# Patient Record
Sex: Male | Born: 1975 | Race: White | Hispanic: No | Marital: Married | Smoking: Never smoker
Health system: Southern US, Community
[De-identification: ages and names within clinical notes are randomized; demographics above are authoritative.]

## PROBLEM LIST (undated history)

## (undated) DIAGNOSIS — I10 Essential (primary) hypertension: Secondary | ICD-10-CM

## (undated) DIAGNOSIS — J45909 Unspecified asthma, uncomplicated: Secondary | ICD-10-CM

## (undated) HISTORY — PX: VASECTOMY: SHX75

## (undated) HISTORY — PX: SMALL INTESTINE SURGERY: SHX150

## (undated) HISTORY — DX: Unspecified asthma, uncomplicated: J45.909

---

## 2010-04-29 ENCOUNTER — Ambulatory Visit: Payer: Self-pay

## 2010-05-01 ENCOUNTER — Emergency Department: Payer: Self-pay | Admitting: Emergency Medicine

## 2013-05-14 ENCOUNTER — Emergency Department (HOSPITAL_BASED_OUTPATIENT_CLINIC_OR_DEPARTMENT_OTHER)
Admission: EM | Admit: 2013-05-14 | Discharge: 2013-05-14 | Disposition: A | Discharge status: Home / Self Care | Payer: BC Managed Care – PPO | Attending: Emergency Medicine | Admitting: Emergency Medicine

## 2013-05-14 ENCOUNTER — Encounter (HOSPITAL_BASED_OUTPATIENT_CLINIC_OR_DEPARTMENT_OTHER): Payer: Self-pay | Admitting: Emergency Medicine

## 2013-05-14 DIAGNOSIS — Y9229 Other specified public building as the place of occurrence of the external cause: Secondary | ICD-10-CM | POA: Insufficient documentation

## 2013-05-14 DIAGNOSIS — W1809XA Striking against other object with subsequent fall, initial encounter: Secondary | ICD-10-CM | POA: Insufficient documentation

## 2013-05-14 DIAGNOSIS — S0181XA Laceration without foreign body of other part of head, initial encounter: Secondary | ICD-10-CM

## 2013-05-14 DIAGNOSIS — Y9389 Activity, other specified: Secondary | ICD-10-CM | POA: Insufficient documentation

## 2013-05-14 DIAGNOSIS — F1010 Alcohol abuse, uncomplicated: Secondary | ICD-10-CM | POA: Insufficient documentation

## 2013-05-14 DIAGNOSIS — S0180XA Unspecified open wound of other part of head, initial encounter: Secondary | ICD-10-CM | POA: Insufficient documentation

## 2013-05-14 NOTE — Discharge Instructions (Signed)
Facial Laceration ° A facial laceration is a cut on the face. These injuries can be painful and cause bleeding. Lacerations usually heal quickly, but they need special care to reduce scarring. °DIAGNOSIS  °Your health care provider will take a medical history, ask for details about how the injury occurred, and examine the wound to determine how deep the cut is. °TREATMENT  °Some facial lacerations may not require closure. Others may not be able to be closed because of an increased risk of infection. The risk of infection and the chance for successful closure will depend on various factors, including the amount of time since the injury occurred. °The wound may be cleaned to help prevent infection. If closure is appropriate, pain medicines may be given if needed. Your health care provider will use stitches (sutures), wound glue (adhesive), or skin adhesive strips to repair the laceration. These tools bring the skin edges together to allow for faster healing and a better cosmetic outcome. If needed, you may also be given a tetanus shot. °HOME CARE INSTRUCTIONS °· Only take over-the-counter or prescription medicines as directed by your health care provider. °· Follow your health care provider's instructions for wound care. These instructions will vary depending on the technique used for closing the wound. °For Sutures: °· Keep the wound clean and dry.   °· If you were given a bandage (dressing), you should change it at least once a day. Also change the dressing if it becomes wet or dirty, or as directed by your health care provider.   °· Wash the wound with soap and water 2 times a day. Rinse the wound off with water to remove all soap. Pat the wound dry with a clean towel.   °· After cleaning, apply a thin layer of the antibiotic ointment recommended by your health care provider. This will help prevent infection and keep the dressing from sticking.   °· You may shower as usual after the first 24 hours. Do not soak the  wound in water until the sutures are removed.   °· Get your sutures removed as directed by your health care provider. With facial lacerations, sutures should usually be taken out after 4 5 days to avoid stitch marks.   °· Wait a few days after your sutures are removed before applying any makeup. °For Skin Adhesive Strips: °· Keep the wound clean and dry.   °· Do not get the skin adhesive strips wet. You may bathe carefully, using caution to keep the wound dry.   °· If the wound gets wet, pat it dry with a clean towel.   °· Skin adhesive strips will fall off on their own. You may trim the strips as the wound heals. Do not remove skin adhesive strips that are still stuck to the wound. They will fall off in time.   °For Wound Adhesive: °· You may briefly wet your wound in the shower or bath. Do not soak or scrub the wound. Do not swim. Avoid periods of heavy sweating until the skin adhesive has fallen off on its own. After showering or bathing, gently pat the wound dry with a clean towel.   °· Do not apply liquid medicine, cream medicine, ointment medicine, or makeup to your wound while the skin adhesive is in place. This may loosen the film before your wound is healed.   °· If a dressing is placed over the wound, be careful not to apply tape directly over the skin adhesive. This may cause the adhesive to be pulled off before the wound is healed.   °·   Avoid prolonged exposure to sunlight or tanning lamps while the skin adhesive is in place. °· The skin adhesive will usually remain in place for 5 10 days, then naturally fall off the skin. Do not pick at the adhesive film.   °After Healing: °Once the wound has healed, cover the wound with sunscreen during the day for 1 full year. This can help minimize scarring. Exposure to ultraviolet light in the first year will darken the scar. It can take 1 2 years for the scar to lose its redness and to heal completely.  °SEEK IMMEDIATE MEDICAL CARE IF: °· You have redness, pain, or  swelling around the wound.   °· You see a yellowish-white fluid (pus) coming from the wound.   °· You have chills or a fever.   °MAKE SURE YOU: °· Understand these instructions. °· Will watch your condition. °· Will get help right away if you are not doing well or get worse. °Document Released: 04/17/2004 Document Revised: 12/29/2012 Document Reviewed: 10/21/2012 °ExitCare® Patient Information ©2014 [employer]. ° °
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
## 2013-05-21 ENCOUNTER — Encounter (HOSPITAL_BASED_OUTPATIENT_CLINIC_OR_DEPARTMENT_OTHER): Payer: Self-pay | Admitting: Emergency Medicine

## 2013-05-21 ENCOUNTER — Emergency Department (HOSPITAL_BASED_OUTPATIENT_CLINIC_OR_DEPARTMENT_OTHER)
Admission: EM | Admit: 2013-05-21 | Discharge: 2013-05-21 | Disposition: A | Discharge status: Home / Self Care | Payer: BC Managed Care – PPO | Attending: Emergency Medicine | Admitting: Emergency Medicine

## 2013-05-21 DIAGNOSIS — Z4802 Encounter for removal of sutures: Secondary | ICD-10-CM

## 2013-05-21 NOTE — ED Notes (Signed)
Wound care complete with folded 2x2's over eyebrow repair site, then held in place with paper tape. I gave patient additional wound care supplies for home use.

## 2013-05-21 NOTE — ED Notes (Signed)
Pt here for suture removal, placed last friday

## 2013-05-21 NOTE — ED Provider Notes (Signed)
CSN: [id number]     Arrival date & time 05/21/13  0105 History   First MD Initiated Contact with Patient 05/21/13 0121     Chief Complaint  Patient presents with  . Suture / Staple Removal     (Consider location/radiation/quality/duration/timing/severity/associated sxs/prior Treatment) Patient is a 38 y.o. male presenting with suture removal.  Suture / Staple Removal This is a new problem. The current episode started more than 2 days ago. The problem occurs constantly. The problem has not changed since onset.Pertinent negatives include no chest pain, no abdominal pain, no headaches and no shortness of breath. Nothing aggravates the symptoms. Nothing relieves the symptoms. He has tried nothing for the symptoms. The treatment provided no relief.    History reviewed. No pertinent past medical history. Past Surgical History  Procedure Laterality Date  . Vasectomy     History reviewed. No pertinent family history. History  Substance Use Topics  . Smoking status: Never Smoker   . Smokeless tobacco: Never Used  . Alcohol Use: 0.6 oz/week    1 Shots of liquor per week     Comment: daily etoh use     Review of Systems  Respiratory: Negative for shortness of breath.   Cardiovascular: Negative for chest pain.  Gastrointestinal: Negative for abdominal pain.  Neurological: Negative for headaches.  All other systems reviewed and are negative.      Allergies  Review of patient's allergies indicates no known allergies.  Home Medications  No current outpatient prescriptions on file. BP 128/83  Temp(Src) 98 F (36.7 C) (Oral)  Resp 18  SpO2 98% Physical Exam  Constitutional: He is oriented to person, place, and time. He appears well-developed and well-nourished. No distress.  HENT:  Head: Normocephalic.  Eyes: EOM are normal.  Neck: Normal range of motion. Neck supple.  Musculoskeletal: Normal range of motion.  Neurological: He is alert and oriented to person, place, and time.   Skin: Skin is warm and dry.  Psychiatric: He has a normal mood and affect.    ED Course  Procedures (including critical care time) Labs Review Labs Reviewed - No data to display Imaging Review No results found.   EKG Interpretation None      MDM   Final diagnoses:  None    Suture over the right eye and under the right eye removed no warmth or drainage bandaged   Nathon Stefanski K Antwuan Eckley-Rasch, MD 05/21/13 0139

## 2013-05-21 NOTE — Discharge Instructions (Signed)
Staple Care and Removal Your caregiver has used staples today to repair your wound. Staples are used to help a wound heal faster by holding the edges of the wound together. The staples can be removed when the wound has healed well enough to stay together after the staples are removed. A dressing (wound covering), depending on the location of the wound, may have been applied. This may be changed once per day or as instructed. If the dressing sticks, it may be soaked off with soapy water or hydrogen peroxide. Only take over-the-counter or prescription medicines for pain, discomfort, or fever as directed by your caregiver.  If you did not receive a tetanus shot today because you did not recall when your last one was given, check with your caregiver when you have your staples removed to determine if one is needed. Return to your caregiver's office in 1 week or as suggested to have your staples removed. SEEK IMMEDIATE MEDICAL CARE IF:   You have redness, swelling, or increasing pain in the wound.  You have pus coming from the wound.  You have a fever.  You notice a bad smell coming from the wound or dressing.  Your wound edges break open after staples have been removed. Document Released: 12/03/2000 Document Revised: 06/02/2011 Document Reviewed: 12/18/2004 [facility] Patient Information 2014 [place], [place].  Suture Removal, Care After Refer to this sheet in the next few weeks. These instructions provide you with information on caring for yourself after your procedure. Your health care provider may also give you more specific instructions. Your treatment has been planned according to current medical practices, but problems sometimes occur. Call your health care provider if you have any problems or questions after your procedure. WHAT TO EXPECT AFTER THE PROCEDURE After your stitches (sutures) are removed, it is typical to have the following:  Some discomfort and swelling in the wound  area.  Slight redness in the area. HOME CARE INSTRUCTIONS   If you have skin adhesive strips over the wound area, do not take the strips off. They will fall off on their own in a few days. If the strips remain in place after 14 days, you may remove them.  Change any bandages (dressings) at least once a day or as directed by your health care provider. If the bandage sticks, soak it off with warm, soapy water.  Apply cream or ointment only as directed by your health care provider. If using cream or ointment, wash the area with soap and water 2 times a day to remove all the cream or ointment. Rinse off the soap and pat the area dry with a clean towel.  Keep the wound area dry and clean. If the bandage becomes wet or dirty, or if it develops a bad smell, change it as soon as possible.  Continue to protect the wound from injury.  Use sunscreen when out in the sun. New scars become sunburned easily. SEEK MEDICAL CARE IF:  You have increasing redness, swelling, or pain in the wound.  You see pus coming from the wound.  You have a fever.  You notice a bad smell coming from the wound or dressing.  Your wound breaks open (edges not staying together). Document Released: 12/03/2000 Document Revised: 12/29/2012 Document Reviewed: 10/20/2012 [facility] Patient Information 2014 [place], [place].  Scar Minimization You will have a scar anytime you have surgery and a cut is made in the skin or you have something removed from your skin (mole, skin cancer, cyst). Although scars  are unavoidable following surgery, there are ways to minimize their appearance. It is important to follow all the instructions you receive from your caregiver about wound care. How your wound heals will influence the appearance of your scar. If you do not follow the wound care instructions as directed, complications such as infection may occur. Wound instructions include keeping the wound clean, moist, and not letting the wound form  a scab. Some people form scars that are raised and lumpy (hypertrophic) or larger than the initial wound (keloidal). HOME CARE INSTRUCTIONS   Follow wound care instructions as directed.  Keep the wound clean by washing it with soap and water.  Keep the wound moist with provided antibiotic cream or petroleum jelly until completely healed. Moisten twice a day for about 2 weeks.  Get stitches (sutures) taken out at the scheduled time.  Avoid touching or manipulating your wound unless needed. Wash your hands thoroughly before and after touching your wound.  Follow all restrictions such as limits on exercise or work. This depends on where your scar is located.  Keep the scar protected from sunburn. Cover the scar with sunscreen/sunblock with SPF 30 or higher.  Gently massage the scar using a circular motion to help minimize the appearance of the scar. Do this only after the wound has closed and all the sutures have been removed.  For hypertrophic or keloidal scars, there are several ways to treat and minimize their appearance. Methods include compression therapy, intralesional corticosteroids, laser therapy, or surgery. These methods are performed by your caregiver. Remember that the scar may appear lighter or darker than your normal skin color. This difference in color should even out with time. SEEK MEDICAL CARE IF:   You have a fever.  You develop signs of infection such as pain, redness, pus, and warmth.  You have questions or concerns. Document Released: 08/28/2009 Document Revised: 06/02/2011 Document Reviewed: 08/28/2009 [facility] Patient Information 2014 [place], [place].

## 2013-05-21 NOTE — ED Notes (Signed)
MD at bedside, removing sutures.

## 2013-06-03 ENCOUNTER — Encounter (HOSPITAL_BASED_OUTPATIENT_CLINIC_OR_DEPARTMENT_OTHER): Payer: Self-pay | Admitting: Emergency Medicine

## 2013-06-03 ENCOUNTER — Emergency Department (HOSPITAL_BASED_OUTPATIENT_CLINIC_OR_DEPARTMENT_OTHER)
Admission: EM | Admit: 2013-06-03 | Discharge: 2013-06-03 | Disposition: A | Discharge status: Home / Self Care | Payer: BC Managed Care – PPO | Attending: Emergency Medicine | Admitting: Emergency Medicine

## 2013-06-03 DIAGNOSIS — IMO0002 Reserved for concepts with insufficient information to code with codable children: Secondary | ICD-10-CM | POA: Insufficient documentation

## 2013-06-03 DIAGNOSIS — Z8739 Personal history of other diseases of the musculoskeletal system and connective tissue: Secondary | ICD-10-CM | POA: Insufficient documentation

## 2013-06-03 DIAGNOSIS — M549 Dorsalgia, unspecified: Secondary | ICD-10-CM

## 2013-06-03 DIAGNOSIS — Y92410 Unspecified street and highway as the place of occurrence of the external cause: Secondary | ICD-10-CM | POA: Insufficient documentation

## 2013-06-03 DIAGNOSIS — Y9389 Activity, other specified: Secondary | ICD-10-CM | POA: Insufficient documentation

## 2013-06-03 LAB — URINALYSIS, ROUTINE W REFLEX MICROSCOPIC
BILIRUBIN URINE: NEGATIVE
GLUCOSE, UA: NEGATIVE mg/dL
HGB URINE DIPSTICK: NEGATIVE
Ketones, ur: >80 mg/dL — AB
Nitrite: NEGATIVE
PROTEIN: NEGATIVE mg/dL
Specific Gravity, Urine: 1.026 (ref 1.005–1.030)
Urobilinogen, UA: 1 mg/dL (ref 0.0–1.0)
pH: 7 (ref 5.0–8.0)

## 2013-06-03 LAB — URINE MICROSCOPIC-ADD ON

## 2013-06-03 MED ORDER — HYDROCODONE-ACETAMINOPHEN 5-325 MG PO TABS: 2.0000 | ORAL_TABLET | ORAL | Status: DC | PRN
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
## 2013-06-03 NOTE — ED Provider Notes (Signed)
Medical screening examination/treatment/procedure(s) were performed by non-physician practitioner and as supervising physician I was immediately available for consultation/collaboration.   EKG Interpretation None        Charles B. Bernette MayersSheldon, MD 06/03/13 [phone]
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
## 2013-06-08 ENCOUNTER — Ambulatory Visit (INDEPENDENT_AMBULATORY_CARE_PROVIDER_SITE_OTHER): Payer: BC Managed Care – PPO | Admitting: Family Medicine

## 2013-06-08 ENCOUNTER — Encounter: Payer: Self-pay | Admitting: Family Medicine

## 2013-06-08 VITALS — BP 142/86 | HR 75 | Ht 69.0 in | Wt 170.0 lb

## 2013-06-08 DIAGNOSIS — IMO0002 Reserved for concepts with insufficient information to code with codable children: Secondary | ICD-10-CM

## 2013-06-08 DIAGNOSIS — S3992XA Unspecified injury of lower back, initial encounter: Secondary | ICD-10-CM

## 2013-06-08 DIAGNOSIS — M5416 Radiculopathy, lumbar region: Secondary | ICD-10-CM

## 2013-06-08 MED ORDER — PREDNISONE (PAK) 10 MG PO TABS: ORAL_TABLET | ORAL | Status: DC

## 2013-06-08 MED ORDER — CYCLOBENZAPRINE HCL 10 MG PO TABS: 10.0000 mg | ORAL_TABLET | Freq: Three times a day (TID) | ORAL | Status: DC | PRN

## 2013-06-08 MED ORDER — HYDROCODONE-ACETAMINOPHEN 5-325 MG PO TABS: 1.0000 | ORAL_TABLET | Freq: Four times a day (QID) | ORAL | Status: DC | PRN

## 2013-06-08 NOTE — Patient Instructions (Signed)
You have lumbar radiculopathy (a pinched nerve in your low back from a disc herniation). A prednisone dose pack is the best option for immediate relief and may be prescribed. Day after finishing prednisone start aleve 2 tabs twice a day with food OR ibuprofen 600mg  three times a day with food for pain and inflammation. Norco as needed for severe pain (no driving on this medicine). Flexeril as needed for muscle spasms (no driving on this medicine if it makes you sleepy). Stay as active as possible. Call me in 1 week to let me know about your progress. If not improving, will consider further imaging (MRI). If improving would add physical therapy.

## 2013-06-13 ENCOUNTER — Encounter: Payer: Self-pay | Admitting: Family Medicine

## 2013-06-13 DIAGNOSIS — S3992XA Unspecified injury of lower back, initial encounter: Secondary | ICD-10-CM | POA: Insufficient documentation
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
## 2014-04-09 ENCOUNTER — Ambulatory Visit (INDEPENDENT_AMBULATORY_CARE_PROVIDER_SITE_OTHER): Payer: 59 | Admitting: Physician Assistant

## 2014-04-09 VITALS — BP 140/94 | HR 113 | Temp 98.6°F | Resp 18 | Ht 69.5 in | Wt 205.2 lb

## 2014-04-09 DIAGNOSIS — H938X3 Other specified disorders of ear, bilateral: Secondary | ICD-10-CM

## 2014-04-09 DIAGNOSIS — J029 Acute pharyngitis, unspecified: Secondary | ICD-10-CM

## 2014-04-09 DIAGNOSIS — R062 Wheezing: Secondary | ICD-10-CM

## 2014-04-09 DIAGNOSIS — R05 Cough: Secondary | ICD-10-CM

## 2014-04-09 DIAGNOSIS — J3489 Other specified disorders of nose and nasal sinuses: Secondary | ICD-10-CM

## 2014-04-09 DIAGNOSIS — R059 Cough, unspecified: Secondary | ICD-10-CM

## 2014-04-09 LAB — POCT RAPID STREP A (OFFICE): RAPID STREP A SCREEN: NEGATIVE

## 2014-04-09 MED ORDER — GUAIFENESIN ER 1200 MG PO TB12: 1.0000 | ORAL_TABLET | Freq: Two times a day (BID) | ORAL | Status: AC | PRN

## 2014-04-09 MED ORDER — LORATADINE 10 MG PO TABS: 10.0000 mg | ORAL_TABLET | Freq: Every day | ORAL | Status: AC

## 2014-04-09 MED ORDER — FLUTICASONE PROPIONATE 50 MCG/ACT NA SUSP: 2.0000 | Freq: Every day | NASAL | Status: AC

## 2014-04-09 MED ORDER — ALBUTEROL SULFATE HFA 108 (90 BASE) MCG/ACT IN AERS
2.0000 | INHALATION_SPRAY | RESPIRATORY_TRACT | Status: AC | PRN
Start: 2014-04-09 — End: ?

## 2014-04-09 MED ORDER — AMOXICILLIN-POT CLAVULANATE 875-125 MG PO TABS
1.0000 | ORAL_TABLET | Freq: Two times a day (BID) | ORAL | Status: DC
Start: 2014-04-09 — End: 2014-04-23

## 2014-04-09 NOTE — Progress Notes (Signed)
Subjective:    Patient ID: Stephen Donaldson, male    DOB: [birth year], 39 y.o.   MRN: [id number]  PCP: No PCP Per Patient  Chief Complaint  Patient presents with  . Sore Throat    x1 month (02/21/14); believes it is strep  . Generalized Body Aches    states his whole body hurts  . Ear Pain    both ears hurt  . Cough    not prod any phelgm  . Fever  . Chills  . Anorexia    has not ate in the last  24 hours   Prior to Admission medications   Medication Sig Start Date End Date Taking? Authorizing Provider  Zolpidem Tartrate (AMBIEN PO) Take by mouth.   Yes Historical Provider, MD   Medications, allergies, past medical history, surgical history, family history, social history and problem list reviewed and updated.  HPI  [age] yom with no significant pmh presents today with one month h/o sore throat, body aches, ear congestion, non prod cough.   Sx started approx 5-6 wks ago with gradual onset sore throat, nasal and head congestion, ears feeling full. These sx have persisted since that time. He did go to [place] for the holidays and the sx resolved during the time he was there. He has had intermittent mild non prod cough over past few wks, Feels that this is due to tickle in back of throat. Has had subjective fevers at home but has not checked temp. Has been taking goodys bc powder and   Has hx mild asthma as child but has not used inhalers in years.   He has never been diagnosed with allergies or had allergy sx before. He is from this area.   Denies abd pain, n/v, diarrhea, sob, cp.   Review of Systems No dysuria. No constipation.     Objective:   Physical Exam  Constitutional: He is oriented to person, place, and time. He appears well-developed and well-nourished.  Non-toxic appearance. He does not have a sickly appearance. He appears ill. No distress.  BP 140/94 mmHg  Pulse 113  Temp(Src) 98.6 F (37 C) (Oral)  Resp 18  Ht 5' 9.5" (1.765 m)  Wt 205 lb 3.2 oz (93.078 kg)   BMI 29.88 kg/m2  SpO2 98%   HENT:  Right Ear: Tympanic membrane is not erythematous. A middle ear effusion is present.  Left Ear: Tympanic membrane is not erythematous. A middle ear effusion is present.  Nose: Mucosal edema and rhinorrhea present. Right sinus exhibits no maxillary sinus tenderness and no frontal sinus tenderness. Left sinus exhibits maxillary sinus tenderness and frontal sinus tenderness.  Mouth/Throat: Uvula is midline, oropharynx is clear and moist and mucous membranes are normal. No oropharyngeal exudate, posterior oropharyngeal edema, posterior oropharyngeal erythema or tonsillar abscesses.  Congestion bilaterally. No bony landmarks bilaterally. No normal cone of light bilaterally. No erythema bilaterally.   Severe TTP left frontal sinus. Mod TTP maxillary sinus left.   Pt clearing his throat often while in clinic.   Cardiovascular: Normal rate, regular rhythm and normal heart sounds.  Exam reveals no gallop.   No murmur heard. Pulmonary/Chest: Effort normal. No accessory muscle usage. No tachypnea. No respiratory distress. He has no decreased breath sounds. He has wheezes in the left upper field. He has no rhonchi. He has no rales.  Mild end expiratory wheeze LUL.   Lymphadenopathy:       Head (right side): No submental, no submandibular and no tonsillar adenopathy present.  Head (left side): No submental, no submandibular and no tonsillar adenopathy present.    He has no cervical adenopathy.  Neurological: He is alert and oriented to person, place, and time.  Psychiatric: He has a normal mood and affect. His speech is normal and behavior is normal.      Assessment & Plan:   48 yom with no significant pmh presents today with one month h/o sore throat, body aches, ear congestion, non prod cough.   Sore throat - Plan: POCT rapid strep A, Culture, Group A Strep --strep swab neg, cx sent --tylenol/rest/fluids/lozenges  Congestion of both ears - Plan:  Guaifenesin (MUCINEX MAXIMUM STRENGTH) 1200 MG TB12, loratadine (CLARITIN) 10 MG tablet, fluticasone (FLONASE) 50 MCG/ACT nasal spray Cough Sinus pressure - Plan: amoxicillin-clavulanate (AUGMENTIN) 875-125 MG per tablet --sx most likely due to allergies/sllergic rhinitis with bilateral boggy nasal turbinates, clearing throat, ear congestion, cough due to post nasal drip --claritin, flonase daily --mucinex prn --due to ttp left sinuses and ongoing sx for several wks sent in augmentin 10 days as could have bacterial infx due to chronic congestion --rtc 4-5 days if not improving  Wheezing - Plan: albuterol (PROVENTIL HFA;VENTOLIN HFA) 108 (90 BASE) MCG/ACT inhaler --hx asthma as child --mild wheeze on exam --refilled albuterol    Donnajean Lopes, PA-C Physician Assistant-Certified [facility]  04/09/2014 11:49 AM

## 2014-04-09 NOTE — Patient Instructions (Signed)
Your strep swab was negative today. We sent a culture and I'll let you know if this comes back as positive.  I think your symptoms are most likely due to allergic rhinitis. Please take the claritin daily. Please use the flonase 2 sprays in each nostril once daily. Please take the mucinex twice daily. Drink plenty of water with this medication. Tylenol/iburprofen/rest/fluids for the sore throat.  As you've been congested for awhile and you have pain over your sinuses, you may have a sinus infection. Please take the augmentin twice daily for 10 days.  Use the albuterol inhaler if you feel like you're wheezing at all at home. Please return to clinic if you're not feeling better in 4-5 days.

## 2014-04-11 LAB — CULTURE, GROUP A STREP: Organism ID, Bacteria: NORMAL

## 2014-04-19 ENCOUNTER — Emergency Department
Admission: EM | Admit: 2014-04-19 | Discharge: 2014-04-19 | Discharge status: Absent without leave | Payer: 59 | Source: Home / Self Care

## 2014-04-23 ENCOUNTER — Ambulatory Visit (INDEPENDENT_AMBULATORY_CARE_PROVIDER_SITE_OTHER): Payer: 59 | Admitting: Physician Assistant

## 2014-04-23 VITALS — BP 136/78 | HR 109 | Temp 98.4°F | Resp 19 | Ht 69.0 in | Wt 203.8 lb

## 2014-04-23 DIAGNOSIS — Z5181 Encounter for therapeutic drug level monitoring: Secondary | ICD-10-CM

## 2014-04-23 DIAGNOSIS — G4700 Insomnia, unspecified: Secondary | ICD-10-CM

## 2014-04-23 DIAGNOSIS — J029 Acute pharyngitis, unspecified: Secondary | ICD-10-CM

## 2014-04-23 DIAGNOSIS — J302 Other seasonal allergic rhinitis: Secondary | ICD-10-CM

## 2014-04-23 LAB — CBC WITH DIFFERENTIAL/PLATELET
BASOS ABS: 0.1 10*3/uL (ref 0.0–0.1)
BASOS PCT: 1 % (ref 0–1)
EOS ABS: 0.6 10*3/uL (ref 0.0–0.7)
Eosinophils Relative: 7 % — ABNORMAL HIGH (ref 0–5)
HEMATOCRIT: 53.3 % — AB (ref 39.0–52.0)
HEMOGLOBIN: 18.4 g/dL — AB (ref 13.0–17.0)
LYMPHS ABS: 1.8 10*3/uL (ref 0.7–4.0)
Lymphocytes Relative: 20 % (ref 12–46)
MCH: 31.7 pg (ref 26.0–34.0)
MCHC: 34.5 g/dL (ref 30.0–36.0)
MCV: 91.7 fL (ref 78.0–100.0)
MONOS PCT: 7 % (ref 3–12)
MPV: 11.6 fL (ref 8.6–12.4)
Monocytes Absolute: 0.6 10*3/uL (ref 0.1–1.0)
NEUTROS ABS: 5.9 10*3/uL (ref 1.7–7.7)
NEUTROS PCT: 65 % (ref 43–77)
PLATELETS: 250 10*3/uL (ref 150–400)
RBC: 5.81 MIL/uL (ref 4.22–5.81)
RDW: 14.1 % (ref 11.5–15.5)
WBC: 9.1 10*3/uL (ref 4.0–10.5)

## 2014-04-23 MED ORDER — MAGIC MOUTHWASH W/LIDOCAINE: 5.0000 mL | ORAL | Status: AC | PRN

## 2014-04-23 MED ORDER — HYDROXYZINE HCL 25 MG PO TABS: 50.0000 mg | ORAL_TABLET | Freq: Once | ORAL | Status: AC

## 2014-04-23 NOTE — Progress Notes (Signed)
Subjective:    Patient ID: Windy Carina, male    DOB: November 02, 1975, 39 y.o.   MRN: [id number]  Chief Complaint  Patient presents with  . Follow-up    was here last Sunday, sore throat, worsening, has had for over a month   Prior to Admission medications   Medication Sig Start Date End Date Taking? Authorizing Provider  albuterol (PROVENTIL HFA;VENTOLIN HFA) 108 (90 BASE) MCG/ACT inhaler Inhale 2 puffs into the lungs every 4 (four) hours as needed for wheezing or shortness of breath (cough, shortness of breath or wheezing.). 04/09/14  Yes Sherrick Araki, PA  fluticasone (FLONASE) 50 MCG/ACT nasal spray Place 2 sprays into both nostrils daily. 04/09/14  Yes Olivene Cookston, PA  Guaifenesin (MUCINEX MAXIMUM STRENGTH) 1200 MG TB12 Take 1 tablet (1,200 mg total) by mouth every 12 (twelve) hours as needed. 04/09/14  Yes Kylee Nardozzi, PA  loratadine (CLARITIN) 10 MG tablet Take 1 tablet (10 mg total) by mouth daily. 04/09/14  Yes Kylena Mole, PA  Zolpidem Tartrate (AMBIEN PO) Take by mouth.   Yes Historical Provider, MD   Medications, allergies, past medical history, surgical history, family history, social history and problem list reviewed and updated.  HPI  78 yom returns to clinic for continued sore throat.   Pt initially seen by me 2 wks ago for sore throat. Strep swab neg, cx sent which grew normal upper resp flora. Was diagnosed with allergies and congestion as cause of sore throat. Started on flonase/claritin/mucinex. As congestion had been ongoing for 5 wks at that time and he was ttp over left sinuses was started on augmentin 10 day course for sinusitis.  Today he presents stating sore throat has not improved. He finished the augmentin course. He has not taken the claritin much feeling its worthless. He would like a different antihistamine. Not using the flonase daily. Does feel the head congestion is slightly improved. Has trouble sleeping at night as baseline with minimal relief with  ambien. His congestion and sore throat is keeping him up at night on top of usual insomnia.   Sore throat is worst in mornings. Gets better throughout day every day. Cepacol has helped at times. Denies drooling, odynophagia, dysphagia, stiff neck, muffled voice. Denies any sob or difficulty breathing. Able to eat, drink without trouble.   HR slightly elevated today. No fever. Pt states he has felt run down past few wks as well and that 1-2x/week he has had drenching night sweats. Pt has hx mono and thinks he may have again, denies any risk for hiv  Review of Systems No cp, fever, chills, ha, vision changes, n/v, abd pain, diarrhea.     Objective:   Physical Exam  Constitutional: He is oriented to person, place, and time. He appears well-developed and well-nourished.  Non-toxic appearance. He does not have a sickly appearance. He does not appear ill. No distress.  BP 136/78 mmHg  Pulse 109  Temp(Src) 98.4 F (36.9 C) (Oral)  Resp 19  Ht  (1.753 m)  Wt 203 lb 12.8 oz (92.443 kg)  BMI 30.08 kg/m2  SpO2 98%   HENT:  Right Ear: Tympanic membrane normal.  Left Ear: Tympanic membrane normal.  Nose: Mucosal edema and rhinorrhea present. Right sinus exhibits no maxillary sinus tenderness and no frontal sinus tenderness. Left sinus exhibits no maxillary sinus tenderness and no frontal sinus tenderness.  Mouth/Throat: Uvula is midline, oropharynx is clear and moist and mucous membranes are normal. No dental abscesses or uvula  swelling. No oropharyngeal exudate, posterior oropharyngeal edema, posterior oropharyngeal erythema or tonsillar abscesses.  Neck: No tracheal deviation present. No Brudzinski's sign noted.  Cardiovascular: Normal rate, regular rhythm and normal heart sounds.   Pulmonary/Chest: Effort normal and breath sounds normal. No stridor. He has no decreased breath sounds. He has no wheezes. He has no rhonchi. He has no rales.  Abdominal: Soft. Normal appearance and bowel sounds  are normal. There is no splenomegaly. There is no tenderness.  Lymphadenopathy:       Head (right side): Submandibular adenopathy present. No submental and no tonsillar adenopathy present.       Head (left side): Submandibular adenopathy present. No submental and no tonsillar adenopathy present.       Right cervical: No posterior cervical adenopathy present.      Left cervical: No posterior cervical adenopathy present.  Neurological: He is alert and oriented to person, place, and time. No cranial nerve deficit.  Psychiatric: He has a normal mood and affect. His speech is normal.      Assessment & Plan:   [age] yom returns to clinic with continued sore throat.   Sore throat - Plan: Epstein-Barr virus VCA antibody panel, CBC with Differential/Platelet, Alum & Mag Hydroxide-Simeth (MAGIC MOUTHWASH W/LIDOCAINE) SOLN --benign exam today and no fever today or at home, culture normal from 2 wks ago, very low suspicion for fusobacterium, retropharyngeal/peritonsillar abscess, only 1/4 centor criteria for strep and neg strep swab 2 wks ago so doubt strep, also no relief with augmentin --as is worst in am and he is still congested, believe still due to congestion and mouth breathing at night --flonase/hydroxyzine for allergies which may help with insomnia as well, humidifier in room, tylenol/cepacol --has hx gerd --> start zantac bid --dukes magic mouthwash with lido as pt has singing event in 6 days, this may provide temp relief but will not cure --mono test with fatigue, night sweats, sore throat --declines hiv testing --ent referral if no relief  Seasonal allergies - Plan: hydrOXYzine (ATARAX/VISTARIL) 25 MG tablet  Insomnia - Plan: hydrOXYzine (ATARAX/VISTARIL) 25 MG tablet  Encounter for therapeutic drug monitoring - Plan: Basic metabolic panel --bmp due to starting hydroxyzine  Donnajean Lopesodd M. Maebell Lyvers, PA-C Physician Assistant-Certified [facility]  04/24/2014 11:57 AM

## 2014-04-23 NOTE — Patient Instructions (Signed)
We drew labs today to check for mono and I'll be in contact with you with those results. For allergies, please use the flonase daily. Please switch the claritin to zyrtec.  For reflux, please take zantac twice daily. Limiting alcohol will likely help with this as well.  For the pain, continue to take the tylenol as needed, use cepacol as needed. A humidifier in your room at night will help. Please use [employer]'s Magic Mouthwash every 2 hours as needed for the pain.  If none of these measures work, please let me know. I'll refer you to see an ENT doctor.

## 2014-04-24 LAB — EPSTEIN-BARR VIRUS VCA ANTIBODY PANEL
EBV EA IgG: 18.7 U/mL — ABNORMAL HIGH (ref <9.0)
EBV NA IGG: 77.3 U/mL — AB (ref <18.0)
EBV VCA IGG: 166 U/mL — AB (ref <18.0)

## 2014-04-24 LAB — BASIC METABOLIC PANEL
BUN: 8 mg/dL (ref 6–23)
CO2: 26 mEq/L (ref 19–32)
Calcium: 9.6 mg/dL (ref 8.4–10.5)
Chloride: 102 mEq/L (ref 96–112)
Creat: 1.14 mg/dL (ref 0.50–1.35)
Glucose, Bld: 78 mg/dL (ref 70–99)
POTASSIUM: 4.6 meq/L (ref 3.5–5.3)
SODIUM: 138 meq/L (ref 135–145)

## 2014-04-25 ENCOUNTER — Telehealth: Payer: Self-pay | Admitting: Radiology

## 2014-04-25 ENCOUNTER — Telehealth: Payer: Self-pay | Admitting: Physician Assistant

## 2014-04-25 NOTE — Telephone Encounter (Signed)
Informed pt mono test was negative for current infx. He states his sore throat is better with tx we discussed yesterday. He also mentions he has been taking a workout supplement for bodybuilding that has se of hr elevation. Notable as HR has been elevated last 2 visits. Informed pt eosinophils elevated on cbc, further making allergic rhinitis likely.

## 2014-04-25 NOTE — Telephone Encounter (Signed)
Pt would like to know results of mono tests. Advised we would have you review first and contact him back with the results.

## 2014-06-22 ENCOUNTER — Encounter (HOSPITAL_BASED_OUTPATIENT_CLINIC_OR_DEPARTMENT_OTHER): Payer: Self-pay

## 2014-06-22 ENCOUNTER — Emergency Department (HOSPITAL_BASED_OUTPATIENT_CLINIC_OR_DEPARTMENT_OTHER)
Admission: EM | Admit: 2014-06-22 | Discharge: 2014-06-22 | Disposition: A | Discharge status: Home / Self Care | Payer: 59 | Attending: Emergency Medicine | Admitting: Emergency Medicine

## 2014-06-22 ENCOUNTER — Emergency Department (HOSPITAL_BASED_OUTPATIENT_CLINIC_OR_DEPARTMENT_OTHER): Payer: 59

## 2014-06-22 DIAGNOSIS — J45909 Unspecified asthma, uncomplicated: Secondary | ICD-10-CM | POA: Insufficient documentation

## 2014-06-22 DIAGNOSIS — R0789 Other chest pain: Secondary | ICD-10-CM | POA: Insufficient documentation

## 2014-06-22 DIAGNOSIS — Z7951 Long term (current) use of inhaled steroids: Secondary | ICD-10-CM | POA: Diagnosis not present

## 2014-06-22 DIAGNOSIS — Z79899 Other long term (current) drug therapy: Secondary | ICD-10-CM | POA: Insufficient documentation

## 2014-06-22 DIAGNOSIS — R002 Palpitations: Secondary | ICD-10-CM | POA: Diagnosis present

## 2014-06-22 LAB — CBC WITH DIFFERENTIAL/PLATELET
Basophils Absolute: 0 10*3/uL (ref 0.0–0.1)
Basophils Relative: 0 % (ref 0–1)
EOS ABS: 0.5 10*3/uL (ref 0.0–0.7)
EOS PCT: 6 % — AB (ref 0–5)
HCT: 48.2 % (ref 39.0–52.0)
Hemoglobin: 16.7 g/dL (ref 13.0–17.0)
LYMPHS PCT: 14 % (ref 12–46)
Lymphs Abs: 1.1 10*3/uL (ref 0.7–4.0)
MCH: 31.9 pg (ref 26.0–34.0)
MCHC: 34.6 g/dL (ref 30.0–36.0)
MCV: 92.2 fL (ref 78.0–100.0)
MONO ABS: 0.8 10*3/uL (ref 0.1–1.0)
Monocytes Relative: 9 % (ref 3–12)
Neutro Abs: 5.8 10*3/uL (ref 1.7–7.7)
Neutrophils Relative %: 71 % (ref 43–77)
Platelets: 192 10*3/uL (ref 150–400)
RBC: 5.23 MIL/uL (ref 4.22–5.81)
RDW: 13.3 % (ref 11.5–15.5)
WBC: 8.2 10*3/uL (ref 4.0–10.5)

## 2014-06-22 LAB — BASIC METABOLIC PANEL
Anion gap: 8 (ref 5–15)
BUN: 10 mg/dL (ref 6–23)
CO2: 26 mmol/L (ref 19–32)
Calcium: 9.4 mg/dL (ref 8.4–10.5)
Chloride: 104 mmol/L (ref 96–112)
Creatinine, Ser: 1.05 mg/dL (ref 0.50–1.35)
GFR calc Af Amer: >90 mL/min (ref >90)
GFR, EST NON AFRICAN AMERICAN: 87 mL/min — AB (ref >90)
Glucose, Bld: 98 mg/dL (ref 70–99)
POTASSIUM: 3.5 mmol/L (ref 3.5–5.1)
Sodium: 138 mmol/L (ref 135–145)

## 2014-06-22 LAB — TROPONIN I

## 2014-06-22 MED ORDER — LORAZEPAM 1 MG PO TABS
1.0000 mg | ORAL_TABLET | Freq: Once | ORAL | Status: AC
  Administered 2014-06-22: 1 mg via ORAL
  Filled 2014-06-22: qty 1

## 2014-06-22 MED ORDER — IBUPROFEN 400 MG PO TABS
400.0000 mg | ORAL_TABLET | Freq: Four times a day (QID) | ORAL | Status: AC | PRN
Start: 2014-06-22 — End: ?

## 2014-06-22 NOTE — ED Notes (Signed)
Pt reports palpitations today. Reports feeling "jittery" and "like I'm having a panic attack. Reports some nausea.

## 2014-06-22 NOTE — ED Provider Notes (Signed)
CSN: [id number]     Arrival date & time 06/22/14  1821 History   First MD Initiated Contact with Patient 06/22/14 1833     Chief Complaint  Patient presents with  . Palpitations     (Consider location/radiation/quality/duration/timing/severity/associated sxs/prior Treatment) HPI Stephen Donaldson is a 39 y.o. male withno Past medical history comes in for evaluation of chest discomfort and jitteriness. Patient states earlier today at approximately 1 PM he was sitting at his desk when he began to have sharp shooting pains in his lower left chest that were fleeting in nature and rated as a 3/10. He reports associated nausea without vomiting or diaphoresis. The pain does not radiate He denies any shortness of breath, headache, unilateral numbness or weakness. Patient does report that he works out frequently and has never experienced this discomfort following vigorous exercise. Patient reports one month ago he was doing bench press and injured the top of his left wrist and would like to have it x-rayed today. Denies any decrease in range of motion, skin color change or sensation. Patient does have a primary care doctor but has not discussed his symptoms with him. No other aggravating or modifying factors. He is concerned that his chest discomfort could be related to anxiety as he reports he is always anxious. Reports a history of panic attack 20 years ago.  Past Medical History  Diagnosis Date  . Asthma    Past Surgical History  Procedure Laterality Date  . Vasectomy    . Small intestine surgery     Family History  Problem Relation Age of Onset  . Diabetes Mother    History  Substance Use Topics  . Smoking status: Never Smoker   . Smokeless tobacco: Never Used  . Alcohol Use: 0.6 oz/week    1 Shots of liquor per week     Comment: daily etoh use     Review of Systems A 10 point review of systems was completed and was negative except for pertinent positives and negatives as mentioned in  the history of present illness     Allergies  Review of patient's allergies indicates no known allergies.  Home Medications   Prior to Admission medications   Medication Sig Start Date End Date Taking? Authorizing Provider  albuterol (PROVENTIL HFA;VENTOLIN HFA) 108 (90 BASE) MCG/ACT inhaler Inhale 2 puffs into the lungs every 4 (four) hours as needed for wheezing or shortness of breath (cough, shortness of breath or wheezing.). 04/09/14   Donnajean Lopesodd M McVeigh, PA  Alum & Mag Hydroxide-Simeth (MAGIC MOUTHWASH W/LIDOCAINE) SOLN Take 5 mLs by mouth every 2 (two) hours as needed for mouth pain. 50/50 ratio with lidocaine. 04/23/14   Huey Bienenstockodd M McVeigh, PA  fluticasone (FLONASE) 50 MCG/ACT nasal spray Place 2 sprays into both nostrils daily. 04/09/14   Huey Bienenstockodd M McVeigh, PA  Guaifenesin [facility](MUCINEX MAXIMUM STRENGTH) 1200 MG TB12 Take 1 tablet (1,200 mg total) by mouth every 12 (twelve) hours as needed. 04/09/14   Huey Bienenstockodd M McVeigh, PA  hydrOXYzine (ATARAX/VISTARIL) 25 MG tablet Take 2 tablets (50 mg total) by mouth once. Please take once 30-60 minutes before sleep. 04/23/14   Huey Bienenstockodd M McVeigh, PA  ibuprofen (ADVIL,MOTRIN) 400 MG tablet Take 1 tablet (400 mg total) by mouth every 6 (six) hours as needed. 06/22/14   Joycie PeekBenjamin Achilles Neville, PA-C  loratadine (CLARITIN) 10 MG tablet Take 1 tablet (10 mg total) by mouth daily. 04/09/14   Huey Bienenstockodd M McVeigh, PA  Zolpidem Tartrate (AMBIEN PO) Take by mouth.  Historical Provider, MD   BP 142/82 mmHg  Pulse 92  Temp(Src) 99 F (37.2 C) (Oral)  Resp 22  Ht  (1.778 m)  Wt 190 lb (86.183 kg)  BMI 27.26 kg/m2  SpO2 98% Physical Exam  Constitutional: He is oriented to person, place, and time. He appears well-developed and well-nourished.  HENT:  Head: Normocephalic and atraumatic.  Mouth/Throat: Oropharynx is clear and moist.  Eyes: Conjunctivae are normal. Pupils are equal, round, and reactive to light. Right eye exhibits no discharge. Left eye exhibits no discharge. No scleral  icterus.  Neck: Neck supple.  Cardiovascular: Normal rate, regular rhythm and normal heart sounds.   Pulmonary/Chest: Effort normal and breath sounds normal. No respiratory distress. He has no wheezes. He has no rales.  Abdominal: Soft. There is no tenderness.  Musculoskeletal: He exhibits no tenderness.  Neurological: He is alert and oriented to person, place, and time.  Cranial Nerves II-XII grossly intact. Motor and sensation 5/5 in all 4 extremities. Completes finger to nose and heel to shin coordination movements without difficulty. Extraocular movements are intact without nystagmus. No pronator drift  Skin: Skin is warm and dry. No rash noted.  Psychiatric: He has a normal mood and affect.  Nursing note and vitals reviewed.   ED Course  Procedures (including critical care time) Labs Review Labs Reviewed  CBC WITH DIFFERENTIAL/PLATELET - Abnormal; Notable for the following:    Eosinophils Relative 6 (*)    All other components within normal limits  BASIC METABOLIC PANEL - Abnormal; Notable for the following:    GFR calc non Af Amer 87 (*)    All other components within normal limits  TROPONIN I    Imaging Review Dg Chest 2 View  06/22/2014   CLINICAL DATA:  Chest pain, nausea, palpitations.  EXAM: CHEST  2 VIEW  COMPARISON:  None.  FINDINGS: Minimal hyperinflation and bronchial thickening. The cardiomediastinal contours are normal. Pulmonary vasculature is normal. No consolidation, pleural effusion, or pneumothorax. No acute osseous abnormalities are seen.  IMPRESSION: Minimal bronchial thickening and hyperinflation, may be chronic or related to acute bronchitis.   Electronically Signed   By: Rubye Oaks M.D.   On: 06/22/2014 19:02   Dg Wrist Complete Left  06/22/2014   CLINICAL DATA:  Posterior LEFT wrist pain for 1 month. Possible lifting injury at the gym. Initial encounter.  EXAM: LEFT WRIST - COMPLETE 3+ VIEW  COMPARISON:  None.  FINDINGS: No fracture. Carpal spacing  normal. Anatomic alignment. Mild soft tissue swelling is present over the dorsum of the wrist on the lateral view.  IMPRESSION: No osseous injury.   Electronically Signed   By: Andreas Newport M.D.   On: 06/22/2014 19:07     EKG Interpretation   Date/Time:  Thursday June 22 2014 18:27:46 EDT Ventricular Rate:  96 PR Interval:  134 QRS Duration: 86 QT Interval:  312 QTC Calculation: 394 R Axis:   74 Text Interpretation:  Normal sinus rhythm with sinus arrhythmia Biatrial  enlargement Septal infarct , age undetermined Abnormal ECG Confirmed by  DELOS  MD, DOUGLAS ([id number]) on 06/22/2014 6:32:59 PM     Meds given in ED:  Medications  LORazepam (ATIVAN) tablet 1 mg (1 mg Oral Given 06/22/14 1948)    Discharge Medication List as of 06/22/2014  8:11 PM    START taking these medications   Details  ibuprofen (ADVIL,MOTRIN) 400 MG tablet Take 1 tablet (400 mg total) by mouth every 6 (six) hours as needed., Starting  06/22/2014, Until Discontinued, Print       Filed Vitals:   06/22/14 1825 06/22/14 1832 06/22/14 2000  BP: 168/86 153/85 142/82  Pulse: 100 96 92  Temp: 99 F (37.2 C)    TempSrc: Oral    Resp: 22    Height:  (1.778 m)    Weight: 190 lb (86.183 kg)    SpO2: 98% 100% 98%    MDM  Vitals stable - WNL -afebrile Pt resting comfortably in ED. Discomfort resolves in ED with PO Ativan. PE--normal long and cardiac exam. Normal neuro exam. Grossly benign physical exam Labwork: Noncontributory, troponin negative, EKG not concerning. Imaging: Chest x-ray shows potential bronchitis, but otherwise no evidence of acute cardiopulmonary pathology. Patient denies any cough or respiratory complaint.  DDX--patient with fleeting, intermittent sharp chest discomfort over the past month located over left chest that is not exacerbated with vigorous activity. Patient with a heart score of 1 due to family history. PERC negative. No evidence of pneumothorax, esophageal rupture or other  mediastinitis. Patient symptoms likely secondary to anxiety. Discussed follow-up with primary care for further evaluation and management of symptoms. Low concern for other acute or emergent pathology at this  time. I discussed all relevant lab findings and imaging results with pt and they verbalized understanding. Discussed f/u with PCP within 48 hrs and return precautions, pt very amenable to plan.  Final diagnoses:  Chest discomfort        Joycie Peek, PA-C 06/23/14 1324  Geoffery Lyons, MD 06/24/14 2306

## 2014-06-22 NOTE — Discharge Instructions (Signed)
Chest Pain (Nonspecific) °It is often hard to give a specific diagnosis for the cause of chest pain. There is always a chance that your pain could be related to something serious, such as a heart attack or a blood clot in the lungs. You need to follow up with your health care provider for further evaluation. °CAUSES  °· Heartburn. °· Pneumonia or bronchitis. °· Anxiety or stress. °· Inflammation around your heart (pericarditis) or lung (pleuritis or pleurisy). °· A blood clot in the lung. °· A collapsed lung (pneumothorax). It can develop suddenly on its own (spontaneous pneumothorax) or from trauma to the chest. °· Shingles infection (herpes zoster virus). °The chest wall is composed of bones, muscles, and cartilage. Any of these can be the source of the pain. °· The bones can be bruised by injury. °· The muscles or cartilage can be strained by coughing or overwork. °· The cartilage can be affected by inflammation and become sore (costochondritis). °DIAGNOSIS  °Lab tests or other studies may be needed to find the cause of your pain. Your health care provider may have you take a test called an ambulatory electrocardiogram (ECG). An ECG records your heartbeat patterns over a 24-hour period. You may also have other tests, such as: °· Transthoracic echocardiogram (TTE). During echocardiography, sound waves are used to evaluate how blood flows through your heart. °· Transesophageal echocardiogram (TEE). °· Cardiac monitoring. This allows your health care provider to monitor your heart rate and rhythm in real time. °· Holter monitor. This is a portable device that records your heartbeat and can help diagnose heart arrhythmias. It allows your health care provider to track your heart activity for several days, if needed. °· Stress tests by exercise or by giving medicine that makes the heart beat faster. °TREATMENT  °· Treatment depends on what may be causing your chest pain. Treatment may include: °¨ Acid blockers for  heartburn. °¨ Anti-inflammatory medicine. °¨ Pain medicine for inflammatory conditions. °¨ Antibiotics if an infection is present. °· You may be advised to change lifestyle habits. This includes stopping smoking and avoiding alcohol, caffeine, and chocolate. °· You may be advised to keep your head raised (elevated) when sleeping. This reduces the chance of acid going backward from your stomach into your esophagus. °Most of the time, nonspecific chest pain will improve within 2-3 days with rest and mild pain medicine.  °HOME CARE INSTRUCTIONS  °· If antibiotics were prescribed, take them as directed. Finish them even if you start to feel better. °· For the next few days, avoid physical activities that bring on chest pain. Continue physical activities as directed. °· Do not use any tobacco products, including cigarettes, chewing tobacco, or electronic cigarettes. °· Avoid drinking alcohol. °· Only take medicine as directed by your health care provider. °· Follow your health care provider's suggestions for further testing if your chest pain does not go away. °· Keep any follow-up appointments you made. If you do not go to an appointment, you could develop lasting (chronic) problems with pain. If there is any problem keeping an appointment, call to reschedule. °SEEK MEDICAL CARE IF:  °· Your chest pain does not go away, even after treatment. °· You have a rash with blisters on your chest. °· You have a fever. °SEEK IMMEDIATE MEDICAL CARE IF:  °· You have increased chest pain or pain that spreads to your arm, neck, jaw, back, or abdomen. °· You have shortness of breath. °· You have an increasing cough, or you cough   up blood.  You have severe back or abdominal pain.  You feel nauseous or vomit.  You have severe weakness.  You faint.  You have chills. This is an emergency. Do not wait to see if the pain will go away. Get medical help at once. Call your local emergency services (911 in [place] Do not drive  yourself to the hospital. MAKE SURE YOU:   Understand these instructions.  Will watch your condition.  Will get help right away if you are not doing well or get worse. Document Released: 12/18/2004 Document Revised: 03/15/2013 Document Reviewed: 10/14/2007 [facility] Patient Information 2015 [employer] [place]. This information is not intended to replace advice given to you by your health care provider. Make sure you discuss any questions you have with your health care provider.   Your evaluation the ED today did not show any emergent causes for your chest discomfort. Your x-rays were negative. It is important for you to follow-up with her primary care for further evaluation and management of your symptoms. Return to ED for new or worsening symptoms. Please take your anti-inflammatories as needed for your wrist discomfort.

## 2016-09-17 ENCOUNTER — Ambulatory Visit
Admission: RE | Admit: 2016-09-17 | Discharge: 2016-09-17 | Disposition: A | Discharge status: Home / Self Care | Payer: 59 | Source: Ambulatory Visit | Attending: Nurse Practitioner | Admitting: Nurse Practitioner

## 2016-09-17 ENCOUNTER — Other Ambulatory Visit: Payer: Self-pay | Admitting: Nurse Practitioner

## 2016-09-17 DIAGNOSIS — R229 Localized swelling, mass and lump, unspecified: Secondary | ICD-10-CM

## 2016-10-15 ENCOUNTER — Other Ambulatory Visit: Payer: Self-pay | Admitting: General Surgery

## 2016-10-15 DIAGNOSIS — R222 Localized swelling, mass and lump, trunk: Secondary | ICD-10-CM

## 2016-11-05 ENCOUNTER — Inpatient Hospital Stay
Admission: RE | Admit: 2016-11-05 | Discharge: 2016-11-05 | Disposition: A | Discharge status: Home / Self Care | Payer: 59 | Source: Ambulatory Visit | Attending: General Surgery | Admitting: General Surgery

## 2017-04-09 ENCOUNTER — Ambulatory Visit: Payer: 59 | Attending: Nurse Practitioner | Admitting: Physical Therapy

## 2017-04-09 ENCOUNTER — Encounter: Payer: Self-pay | Admitting: Physical Therapy

## 2017-04-09 ENCOUNTER — Other Ambulatory Visit: Payer: Self-pay

## 2017-04-09 DIAGNOSIS — M542 Cervicalgia: Secondary | ICD-10-CM | POA: Insufficient documentation

## 2017-04-09 DIAGNOSIS — R252 Cramp and spasm: Secondary | ICD-10-CM | POA: Insufficient documentation

## 2017-04-09 DIAGNOSIS — M5412 Radiculopathy, cervical region: Secondary | ICD-10-CM | POA: Diagnosis present

## 2017-04-09 NOTE — Therapy (Signed)
[facility] [address] [place], [place], [address] Phone: [phone]   Fax:  [phone]  Physical Therapy Evaluation  Patient Details  Name: Stephen Donaldson MRN: [id number] Date of Birth: 1975-04-13 Referring Provider: Babette Relic Body   Encounter Date: 04/09/2017  PT End of Session - 04/09/17 1525    Visit Number  1    Date for PT Re-Evaluation  06/07/17    PT Start Time  1445    PT Stop Time  1543    PT Time Calculation (min)  58 min    Activity Tolerance  Patient tolerated treatment well    Behavior During Therapy  [facility] for tasks assessed/performed       Past Medical History:  Diagnosis Date  . Asthma     Past Surgical History:  Procedure Laterality Date  . SMALL INTESTINE SURGERY    . VASECTOMY      There were no vitals filed for this visit.   Subjective Assessment - 04/09/17 1450    Subjective  Pateint reports that about 3 weeks ago he was at the gym and started having neck and shoulder pain.  He is unsure of a cause, but reports that he does lift heavy weight at the gym.  X-ray was positive for spondylosis at a few levels but was rated as mild    Patient Stated Goals  return to the gym    Currently in Pain?  Yes    Pain Score  4     Pain Location  Neck left shoulder    Pain Orientation  Left    Pain Descriptors / Indicators  Aching;Shooting    Pain Type  Acute pain    Pain Radiating Towards  c/o N/T in the left shoulder to the hand, reports neck pain is constant but the shoulder comes and goes    Pain Onset  1 to 4 weeks ago    Pain Frequency  Constant    Aggravating Factors   Motions of head, reaching up to 9/10, sometimes at end of the day    Pain Relieving Factors  reports that nothing really has helped at best pain  3/10    Effect of Pain on Daily Activities  can't work out and pain at the end of the day         [facility] PT Assessment - 04/09/17 0001      Assessment   Medical Diagnosis   cervicalgia and left shoulder pain    Referring Provider  Tammy Body    Onset Date/Surgical Date  03/19/17    Hand Dominance  Right    Prior Therapy  no      Precautions   Precautions  None      Balance Screen   Has the patient fallen in the past 6 months  No    Has the patient had a decrease in activity level because of a fear of falling?   No    Is the patient reluctant to leave their home because of a fear of falling?   No      Prior Function   Level of Independence  Independent    Vocation  Full time employment    Vocation Requirements  desk job    Leisure  works out 3-4x/week      [employer] Comments  fwd head, severe IR of the shoulders      ROM / Strength   AROM /  PROM / Strength  AROM;Strength      AROM   Overall AROM Comments  cervical ROM flexion WNL's, extension was decreased 50% with pain in the neck, rotation was decreased 25% with pain rotating to the left, side bending WNL''s with pain to the left      Strength   Overall Strength Comments  WNL's, some pain in the left shoulder with ER      Flexibility   Soft Tissue Assessment /Muscle Length  -- very tight IR of the shoulders, tight neural tension      Palpation   Palpation comment  pain in the left arm with palpation on the left cervical area, has spasms in the cervical area      Special Tests   Other special tests  all shoulder tests were negative             Objective measurements completed on examination: See above findings.      [facility] Adult PT Treatment/Exercise - 04/09/17 0001      Modalities   Modalities  Electrical Stimulation;Traction      Programme researcher, broadcasting/film/video Location  C/T area    Statistician Action  IFC    Electrical Stimulation Parameters  supine    Electrical Stimulation Goals  Pain      Traction   Type of Traction  Cervical    Min (lbs)  18    Hold Time  static    Time  pa             PT Education -  04/09/17 1525    Education provided  Yes    Education Details  went over posture, went over gym exercises and advised to stop doing exercises behind the neck    Person(s) Educated  Patient    Methods  Explanation    Comprehension  Verbalized understanding          PT Long Term Goals - 04/09/17 1530      PT LONG TERM GOAL #1   Title  understand proper posture and body mechanics    Time  8    Period  Weeks    Status  New      PT LONG TERM GOAL #2   Title  understand the gym exercises that may cause increased problems for shoulder and neck    Time  8    Period  Weeks    Status  New      PT LONG TERM GOAL #3   Title  decrease pain 50%    Time  8    Period  Weeks    Status  New      PT LONG TERM GOAL #4   Title  decrease shoulder symptoms 50%    Time  8    Period  Weeks    Status  New      PT LONG TERM GOAL #5   Title  increase cervical ROM to WNL's    Time  8    Period  Weeks    Status  New             Plan - 04/09/17 1526    Clinical Impression Statement  Patient reports 3 weeks ago having pain in the neck and left shoulder, unsure of a specific cause.  He worksout 3-4days a week he does lift heavy weight.  He has constant neck pain and at times has left shoulder pain and then has N/T in  the left arm to the hand.  He has no thoracic curve, he has very rounded shoulders with the arms held in slight flexion and a lot of IR. Cervical ROM was limited for extension and left rotation.  Shoulder ROM was WFL's, had some pain with MMT for ER.       Clinical Presentation  Stable    Clinical Decision Making  Low    Rehab Potential  Good    PT Frequency  2x / week    PT Duration  8 weeks    PT Treatment/Interventions  ADLs/Self Care Home Management;Cryotherapy;Electrical Stimulation;Traction;Ultrasound;Neuromuscular re-education;Therapeutic exercise;Therapeutic activities;Patient/family education;Manual techniques    PT Next Visit Plan  start the upper back strengthening     Consulted and Agree with Plan of Care  Patient       Patient will benefit from skilled therapeutic intervention in order to improve the following deficits and impairments:  Decreased range of motion, Increased muscle spasms, Pain, Impaired flexibility, Postural dysfunction  Visit Diagnosis: Cervicalgia - Plan: PT plan of care cert/re-cert  Radiculopathy, cervical region - Plan: PT plan of care cert/re-cert  Cramp and spasm - Plan: PT plan of care cert/re-cert     Problem List Patient Active Problem List   Diagnosis Date Noted  . Seasonal allergies 04/23/2014  . Lower back injury 06/13/2013    Jearld LeschALBRIGHT,Evora Schechter W., PT 04/09/2017, 4:07 PM  [facility] [address] [place], [place], [address] Phone: [phone]   Fax:  [phone]  Name: Stephen Donaldson MRN: [id number] Date of Birth: 06-Sep-1975

## 2017-04-15 ENCOUNTER — Ambulatory Visit: Payer: 59 | Admitting: Physical Therapy

## 2017-04-15 ENCOUNTER — Encounter: Payer: Self-pay | Admitting: Physical Therapy

## 2017-04-15 DIAGNOSIS — M5412 Radiculopathy, cervical region: Secondary | ICD-10-CM

## 2017-04-15 DIAGNOSIS — M542 Cervicalgia: Secondary | ICD-10-CM

## 2017-04-15 DIAGNOSIS — R252 Cramp and spasm: Secondary | ICD-10-CM

## 2017-04-15 NOTE — Therapy (Signed)
[facility] [address] [place], [place], [address] Phone: [phone]   Fax:  [phone]  Physical Therapy Treatment  Patient Details  Name: Stephen Donaldson MRN: [id number] Date of Birth: August 27, 1975 Referring Provider: Babette Relic Body   Encounter Date: 04/15/2017  PT End of Session - 04/15/17 1635    Visit Number  2    Date for PT Re-Evaluation  06/07/17    PT Start Time  1545    PT Stop Time  1646    PT Time Calculation (min)  61 min    Activity Tolerance  Patient tolerated treatment well    Behavior During Therapy  [facility] for tasks assessed/performed       Past Medical History:  Diagnosis Date  . Asthma     Past Surgical History:  Procedure Laterality Date  . SMALL INTESTINE SURGERY    . VASECTOMY      There were no vitals filed for this visit.  Subjective Assessment - 04/15/17 1540    Subjective  "About the same, may be a little worst after last time"    Currently in Pain?  Yes    Pain Score  6     Pain Location  -- Neck and L shoulder                      [facility] Adult PT Treatment/Exercise - 04/15/17 0001      Exercises   Exercises  Neck;Shoulder      Neck Exercises: Machines for Strengthening   UBE (Upper Arm Bike)  L3.5 95frd/3rev      Neck Exercises: Theraband   Shoulder External Rotation  20 reps;Red      Shoulder Exercises: Standing   Other Standing Exercises  Pulley ext 10lb 2x10     Other Standing Exercises  2 way ER scapula stabilization.      Shoulder Exercises: Lawyer  4 reps;10 seconds      Shoulder Exercises: Power Pensions consultant  Rows 2x10 & Lats x 10 25lb       Modalities   Modalities  Electrical Stimulation;Moist Heat      Moist Heat Therapy   Number Minutes Moist Heat  15 Minutes    Moist Heat Location  Shoulder      Electrical Stimulation   Electrical Stimulation Location  C/T area    Electrical Stimulation  Action  IFC    Electrical Stimulation Parameters  sititng    Electrical Stimulation Goals  Pain      Manual Therapy   Manual Therapy  Passive ROM;Manual Traction;Neural Stretch;Soft tissue mobilization    Manual therapy comments  R hand numbness with STM, and nerve glide.    Soft tissue mobilization  posterior para spinales    Passive ROM  L shoulder, and cervical spine all directions    Manual Traction  cervical spine 4X10''    Neural Stretch  LUE median nerve glide                   PT Long Term Goals - 04/09/17 1530      PT LONG TERM GOAL #1   Title  understand proper posture and body mechanics    Time  8    Period  Weeks    Status  New      PT LONG TERM GOAL #2   Title  understand the gym exercises that  may cause increased problems for shoulder and neck    Time  8    Period  Weeks    Status  New      PT LONG TERM GOAL #3   Title  decrease pain 50%    Time  8    Period  Weeks    Status  New      PT LONG TERM GOAL #4   Title  decrease shoulder symptoms 50%    Time  8    Period  Weeks    Status  New      PT LONG TERM GOAL #5   Title  increase cervical ROM to WNL's    Time  8    Period  Weeks    Status  New            Plan - 04/15/17 1636    Clinical Impression Statement  Pt with L arm and hand numbness and tingling during MT. Anterior L shoulder pain with corner stretch, Lat pull downs, corner stretch, and with standing shoulder external rotation. Pt with very rounded and protracted, arms slightly flexed and internally rotated. Tightness noted in anterior delt and pec.    PT Frequency  2x / week    PT Duration  8 weeks    PT Treatment/Interventions  ADLs/Self Care Home Management;Cryotherapy;Electrical Stimulation;Traction;Ultrasound;Neuromuscular re-education;Therapeutic exercise;Therapeutic activities;Patient/family education;Manual techniques    PT Next Visit Plan  start the upper back strengthening, LUE nerve glide       Patient will  benefit from skilled therapeutic intervention in order to improve the following deficits and impairments:  Decreased range of motion, Increased muscle spasms, Pain, Impaired flexibility, Postural dysfunction  Visit Diagnosis: Radiculopathy, cervical region  Cervicalgia  Cramp and spasm     Problem List Patient Active Problem List   Diagnosis Date Noted  . Seasonal allergies 04/23/2014  . Lower back injury 06/13/2013    Stephen Donaldson, PTA 04/15/2017, 4:45 PM  [facility] [address] [place], [place], [address] Phone: [phone]   Fax:  [phone]  Name: Stephen Donaldson MRN: [id number] Date of Birth: 1975-04-15

## 2017-04-19 ENCOUNTER — Emergency Department (HOSPITAL_COMMUNITY)
Admission: EM | Admit: 2017-04-19 | Discharge: 2017-04-19 | Disposition: A | Discharge status: Home / Self Care | Payer: 59 | Attending: Emergency Medicine | Admitting: Emergency Medicine

## 2017-04-19 ENCOUNTER — Encounter (HOSPITAL_COMMUNITY): Payer: Self-pay

## 2017-04-19 DIAGNOSIS — Z79899 Other long term (current) drug therapy: Secondary | ICD-10-CM | POA: Diagnosis not present

## 2017-04-19 DIAGNOSIS — M5412 Radiculopathy, cervical region: Secondary | ICD-10-CM

## 2017-04-19 DIAGNOSIS — M542 Cervicalgia: Secondary | ICD-10-CM | POA: Diagnosis present

## 2017-04-19 DIAGNOSIS — J45909 Unspecified asthma, uncomplicated: Secondary | ICD-10-CM | POA: Diagnosis not present

## 2017-04-19 DIAGNOSIS — I10 Essential (primary) hypertension: Secondary | ICD-10-CM | POA: Diagnosis not present

## 2017-04-19 HISTORY — DX: Essential (primary) hypertension: I10

## 2017-04-19 MED ORDER — HYDROCODONE-ACETAMINOPHEN 5-325 MG PO TABS: 1.0000 | ORAL_TABLET | Freq: Four times a day (QID) | ORAL | 0 refills | Status: AC | PRN

## 2017-04-19 MED ORDER — METHOCARBAMOL 500 MG PO TABS: 500.0000 mg | ORAL_TABLET | Freq: Two times a day (BID) | ORAL | 0 refills | Status: AC

## 2017-04-19 MED ORDER — PREDNISONE 20 MG PO TABS: 40.0000 mg | ORAL_TABLET | Freq: Every day | ORAL | 0 refills | Status: AC

## 2017-04-19 NOTE — Discharge Instructions (Signed)
Please read attached information. If you experience any new or worsening signs or symptoms please return to the emergency room for evaluation. Please follow-up with your primary care provider or specialist as discussed. Please use medication prescribed only as directed and discontinue taking if you have any concerning signs or symptoms.   °

## 2017-04-19 NOTE — ED Notes (Signed)
Declined W/C at D/C and was escorted to lobby by RN. 

## 2017-04-19 NOTE — ED Triage Notes (Signed)
Patient complains of posterior neck pain with pain to left shoulder and intermittent hand numbness x 1 month. Started after lifting weights 1 month ago, pain with any ROM

## 2017-04-19 NOTE — ED Provider Notes (Signed)
[facility] EMERGENCY DEPARTMENT Provider Note   CSN: [id number] Arrival date & time: 04/19/17  1128     History   Chief Complaint No chief complaint on file.   HPI Stephen Donaldson is a 42 y.o. male.  HPI     42 year old male presents today with complaints of neck back and shoulder pain.  Patient notes that approximately 2 months ago he was lifting weights doing bicep curls.  He notes a pain in the posterior aspect of his neck.  He notes this radiates down into his left arm with intermittent tingling and numbness in the fingers.  Patient notes that this is constantly there, worse with certain movements, worse at night.  He has been seen by urgent care and primary care who prescribed steroids and pain medicine, he was referred to physical therapy which he reports is worsening his symptoms.  Patient denies any other acute neurological deficits.  Patient was referred to outpatient neurology and has an appointment in April.  Patient notes some improvement with muscle relaxers at home.  Patient notes a minor decrease in strength in the left upper extremity.  No other distal neurological deficits.      Past Medical History:  Diagnosis Date  . Asthma   . Hypertension     Patient Active Problem List   Diagnosis Date Noted  . Seasonal allergies 04/23/2014  . Lower back injury 06/13/2013    Past Surgical History:  Procedure Laterality Date  . SMALL INTESTINE SURGERY    . VASECTOMY         Home Medications    Prior to Admission medications   Medication Sig Start Date End Date Taking? Authorizing Provider  albuterol (PROVENTIL HFA;VENTOLIN HFA) 108 (90 BASE) MCG/ACT inhaler Inhale 2 puffs into the lungs every 4 (four) hours as needed for wheezing or shortness of breath (cough, shortness of breath or wheezing.). 04/09/14   McVeigh, Tawanna Cooler, PA  Alum & Mag Hydroxide-Simeth (MAGIC MOUTHWASH W/LIDOCAINE) SOLN Take 5 mLs by mouth every 2 (two) hours as needed for  mouth pain. 50/50 ratio with lidocaine. 04/23/14   McVeigh, Todd, PA  fluticasone (FLONASE) 50 MCG/ACT nasal spray Place 2 sprays into both nostrils daily. 04/09/14   McVeigh, Tawanna Cooler, PA  Guaifenesin (MUCINEX MAXIMUM STRENGTH) 1200 MG TB12 Take 1 tablet (1,200 mg total) by mouth every 12 (twelve) hours as needed. 04/09/14   McVeigh, Tawanna Cooler, PA  HYDROcodone-acetaminophen (NORCO/VICODIN) 5-325 MG tablet Take 1 tablet by mouth every 6 (six) hours as needed. 04/19/17   Salwa Bai, Tinnie Gens, PA-C  hydrOXYzine (ATARAX/VISTARIL) 25 MG tablet Take 2 tablets (50 mg total) by mouth once. Please take once 30-60 minutes before sleep. 04/23/14   McVeigh, Tawanna Cooler, PA  ibuprofen (ADVIL,MOTRIN) 400 MG tablet Take 1 tablet (400 mg total) by mouth every 6 (six) hours as needed. 06/22/14   Cartner, Sharlet Salina, PA-C  loratadine (CLARITIN) 10 MG tablet Take 1 tablet (10 mg total) by mouth daily. 04/09/14   McVeigh, Tawanna Cooler, PA  methocarbamol (ROBAXIN) 500 MG tablet Take 1 tablet (500 mg total) by mouth 2 (two) times daily. 04/19/17   Johney Perotti, Tinnie Gens, PA-C  predniSONE (DELTASONE) 20 MG tablet Take 2 tablets (40 mg total) by mouth daily. 04/19/17   Kashari Chalmers, Tinnie Gens, PA-C  Zolpidem Tartrate (AMBIEN PO) Take by mouth.    [provider]    Family History Family History  Problem Relation Age of Onset  . Diabetes Mother     Social History Social History   Tobacco Use  .  Smoking status: Never Smoker  . Smokeless tobacco: Never Used  Substance Use Topics  . Alcohol use: Yes    Alcohol/week: 0.6 oz    Types: 1 Shots of liquor per week    Comment: daily etoh use   . Drug use: No     Allergies   Patient has no known allergies.   Review of Systems Review of Systems  All other systems reviewed and are negative.    Physical Exam Updated Vital Signs BP (!) 157/97   Pulse (!) 110   Temp 98 F (36.7 C) (Oral)   Resp 18   SpO2 97%   Physical Exam  Constitutional: He is oriented to person, place, and time. He appears  well-developed and well-nourished.  HENT:  Head: Normocephalic and atraumatic.  Eyes: Conjunctivae are normal. Pupils are equal, round, and reactive to light. Right eye exhibits no discharge. Left eye exhibits no discharge. No scleral icterus.  Neck: Normal range of motion. No JVD present. No tracheal deviation present.  Pulmonary/Chest: Effort normal. No stridor.  Musculoskeletal:  No significant CT or L-spine tenderness to palpation axial cervical compression, pain with flexion and extension of the neck.  Shoulder nontender to palpation pain with AB duction of the shoulder, distal sensation strength and motor function intact, grip strength 5 out of 5, radial pulse 2+  Neurological: He is alert and oriented to person, place, and time. Coordination normal.  Psychiatric: He has a normal mood and affect. His behavior is normal. Judgment and thought content normal.  Nursing note and vitals reviewed.    ED Treatments / Results  Labs (all labs ordered are listed, but only abnormal results are displayed) Labs Reviewed - No data to display  EKG  EKG Interpretation None       Radiology No results found.  Procedures Procedures (including critical care time)  Medications Ordered in ED Medications - No data to display   Initial Impression / Assessment and Plan / ED Course  I have reviewed the triage vital signs and the nursing notes.  Pertinent labs & imaging results that were available during my care of the patient were reviewed by me and considered in my medical decision making (see chart for details).     Final Clinical Impressions(s) / ED Diagnoses   Final diagnoses:  Cervical radiculopathy    Labs:   Imaging:  Consults:  Therapeutics:  Discharge Meds: Norco, Robaxin, prednisone  Assessment/Plan: 42 year old male presents today with likely radiculopathy.  No other acute neurological deficits that would indicate central cause.  Find patient would benefit from  outpatient follow-up with neurosurgery, no indication for emergent MRI or further imaging at this time.  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  Patient will be given short course of pain medication muscle relaxers and steroids.  Patient given strict return precautions, both him and his significant other verbalized understanding and agreement to today's plan had no further questions or concerns at the time of discharge.      ED Discharge Orders        Ordered    predniSONE (DELTASONE) 20 MG tablet  Daily     04/19/17 1314    methocarbamol (ROBAXIN) 500 MG tablet  2 times daily     04/19/17 1314    HYDROcodone-acetaminophen (NORCO/VICODIN) 5-325 MG tablet  Every 6 hours PRN     04/19/17 1314       Calix Heinbaugh, Tinnie Gens, PA-C 04/19/17 1315    [place], [employer]  Domingo CockingEduardo, MD 04/20/17 2107

## 2017-04-19 NOTE — ED Triage Notes (Signed)
Pt [facility] and treatment of Lt neck,lt arm pain. Pt also reports he went to his PCP for assessment and treatment of same pain. Pt reports pain is not better . Pt does report he has a Neuro appt. In April. Pain is worse and needs some relief.

## 2017-04-23 ENCOUNTER — Encounter: Payer: Self-pay | Admitting: Physical Therapy

## 2017-04-23 ENCOUNTER — Ambulatory Visit: Payer: 59 | Admitting: Physical Therapy

## 2017-04-23 DIAGNOSIS — R252 Cramp and spasm: Secondary | ICD-10-CM

## 2017-04-23 DIAGNOSIS — M542 Cervicalgia: Secondary | ICD-10-CM | POA: Diagnosis not present

## 2017-04-23 DIAGNOSIS — M5412 Radiculopathy, cervical region: Secondary | ICD-10-CM

## 2017-04-23 NOTE — Therapy (Addendum)
[place], [place], [address] Phone: [phone]   Fax:  [phone]  Physical Therapy Treatment  Patient Details  Name: Stephen Donaldson MRN: [id number] Date of Birth: March 08, 1976 Referring Provider: Lynelle Smoke Body   Encounter Date: 04/23/2017  PT End of Session - 04/23/17 1616    Visit Number  3    Date for PT Re-Evaluation  06/07/17    PT Start Time  1600    PT Stop Time  1616    PT Time Calculation (min)  16 min       Past Medical History:  Diagnosis Date  . Asthma   . Hypertension     Past Surgical History:  Procedure Laterality Date  . SMALL INTESTINE SURGERY    . VASECTOMY      There were no vitals filed for this visit.  Subjective Assessment - 04/23/17 1600    Subjective  Pt reports that he had to go to the ER Sunday due to pain. Pt reports that he is ok long as he is taking pain pills and muscle relaxer's    Currently in Pain?  Yes    Pain Location  Neck    Pain Orientation  Left                      [facility] Adult PT Treatment/Exercise - 04/23/17 0001      Neck Exercises: Machines for Strengthening   UBE (Upper Arm Bike)  L3.5 67fd/2rev      Manual Therapy   Manual Therapy  Passive ROM;Manual Traction;Neural Stretch;Soft tissue mobilization    Manual therapy comments  R hand numbness with STM around superior L scapula    Soft tissue mobilization  posterior para spinales    Passive ROM  L shoulder, and cervical spine all directions                  PT Long Term Goals - 04/09/17 1530      PT LONG TERM GOAL #1   Title  understand proper posture and body mechanics    Time  8    Period  Weeks    Status  New      PT LONG TERM GOAL #2   Title  understand the gym exercises that may cause increased problems for shoulder and neck    Time  8    Period  Weeks    Status  New      PT LONG TERM GOAL #3   Title  decrease pain 50%    Time  8    Period  Weeks    Status  New      PT LONG TERM GOAL #4   Title  decrease shoulder symptoms 50%    Time  8    Period  Weeks    Status  New      PT LONG TERM GOAL #5   Title  increase cervical ROM to WNL's    Time  8    Period  Weeks    Status  New            Plan - 04/23/17 1617    Clinical Impression Statement  No progress towards goals. Pt reports no improvement, had to go to the ER a few days after last therapy treatment. Pt reports severe numbness ans tingling with palpation around L scapula. Told pt not to schedule more appointments until after Neuro  appointment.    Rehab Potential  Good    PT Frequency  2x / week    PT Duration  8 weeks    PT Treatment/Interventions  ADLs/Self Care Home Management;Cryotherapy;Electrical Stimulation;Traction;Ultrasound;Neuromuscular re-education;Therapeutic exercise;Therapeutic activities;Patient/family education;Manual techniques    PT Next Visit Plan  pt reports that he has an appointment with the neuro MD next week       Patient will benefit from skilled therapeutic intervention in order to improve the following deficits and impairments:  Decreased range of motion, Increased muscle spasms, Pain, Impaired flexibility, Postural dysfunction  Visit Diagnosis: Radiculopathy, cervical region  Cervicalgia  Cramp and spasm     Problem List Patient Active Problem List   Diagnosis Date Noted  . Seasonal allergies 04/23/2014  . Lower back injury 06/13/2013   PHYSICAL THERAPY DISCHARGE SUMMARY  Visits from Start of Care: 3 Plan: Patient agrees to discharge.  Patient goals were not met. Patient is being discharged due to not returning since the last visit.  ?????     Scot Jun, PTA 04/23/2017, 4:22 PM  [place], [place], [address] Phone: [phone]   Fax:  [phone]  Name: Stephen Donaldson MRN: [id number] Date of Birth:  1975-04-24

## 2017-09-19 ENCOUNTER — Other Ambulatory Visit: Payer: Self-pay | Admitting: Neurosurgery

## 2017-09-19 DIAGNOSIS — M4722 Other spondylosis with radiculopathy, cervical region: Secondary | ICD-10-CM

## 2017-10-02 ENCOUNTER — Other Ambulatory Visit: Payer: 59

## 2019-01-14 IMAGING — US US PELVIS LIMITED
1 series · 8 of 8 positions shown · non-contrast
Comparison: None.

CLINICAL DATA: 41-year-old male with a palpable abnormality in the
inferior right buttock

EXAM:
LIMITED ULTRASOUND OF PELVIS
TECHNIQUE: Limited transabdominal ultrasound examination of the pelvis was
performed.

[Series 1: us pelvis limited · 0.11mm/px · 8 of 8 slices shown]
[im 1/8]
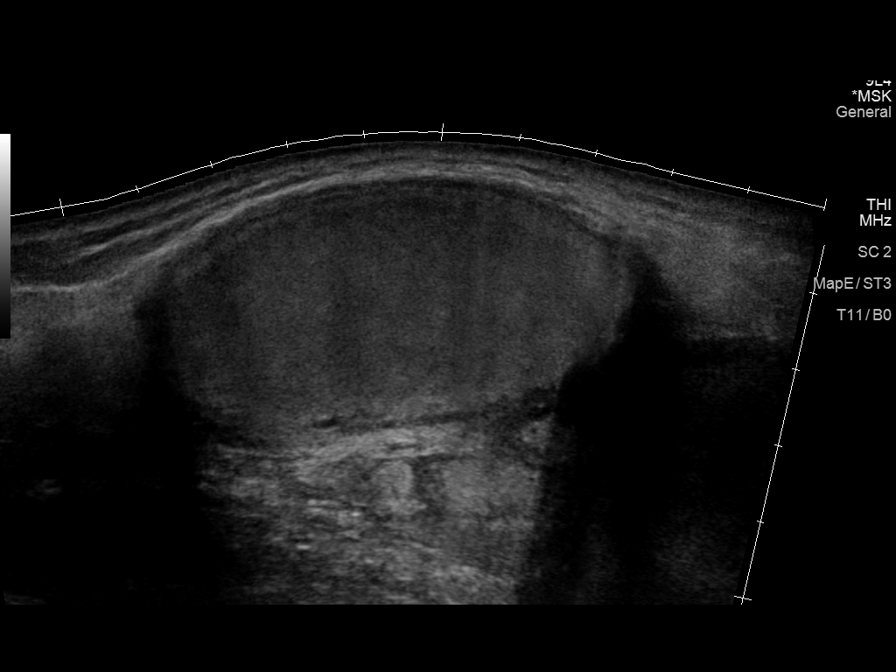
[im 2/8]
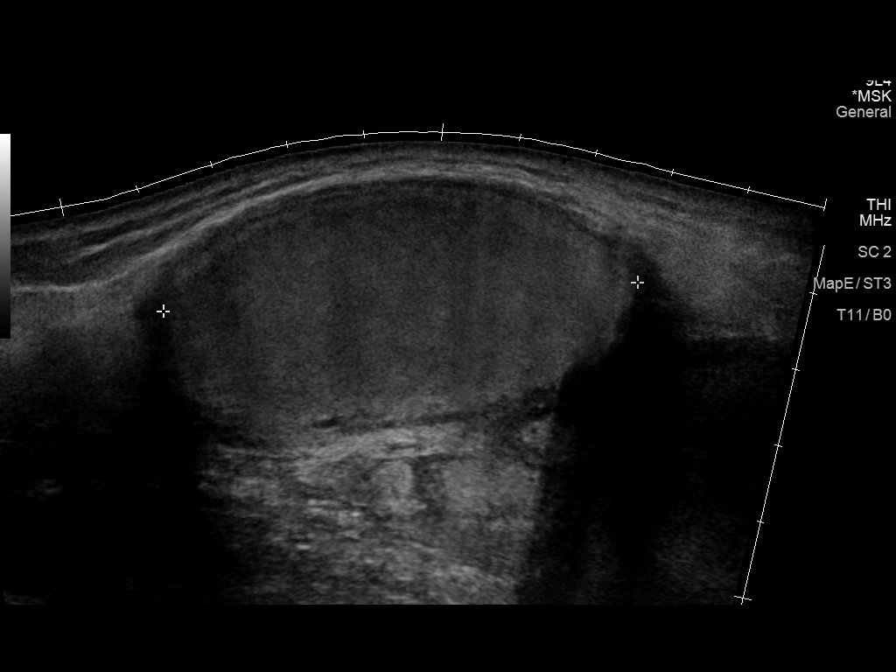
[im 3/8]
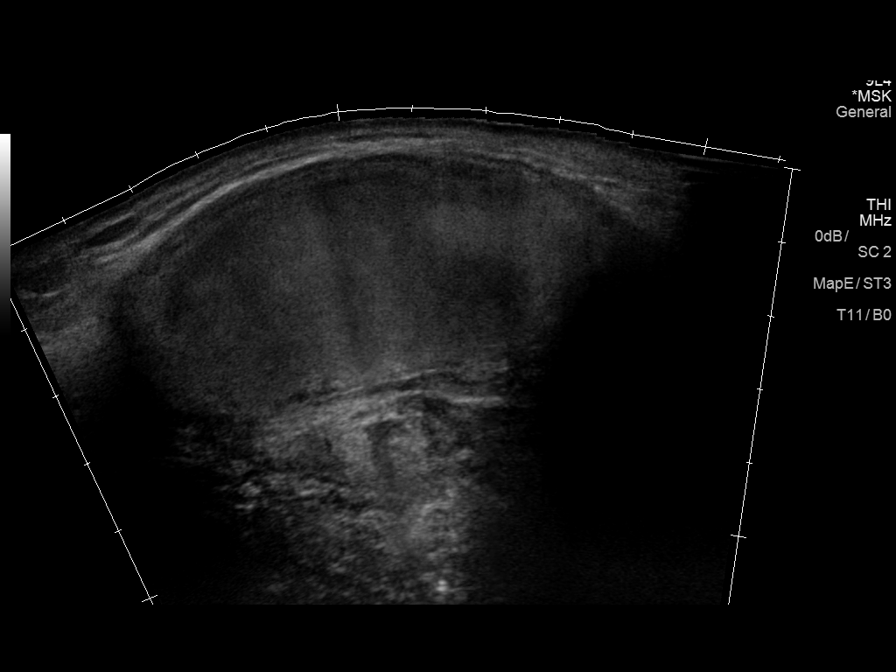
[im 4/8]
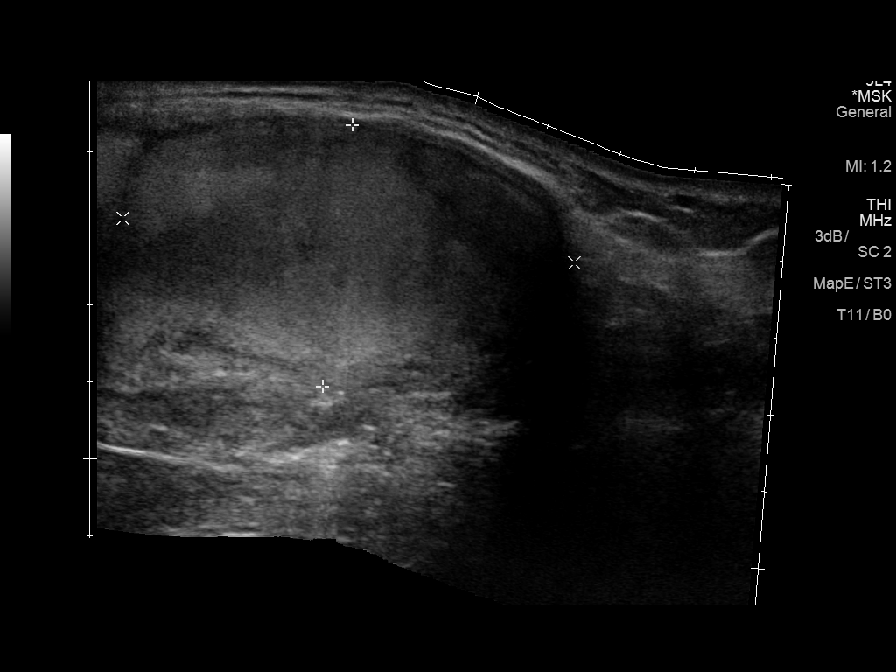
[im 5/8]
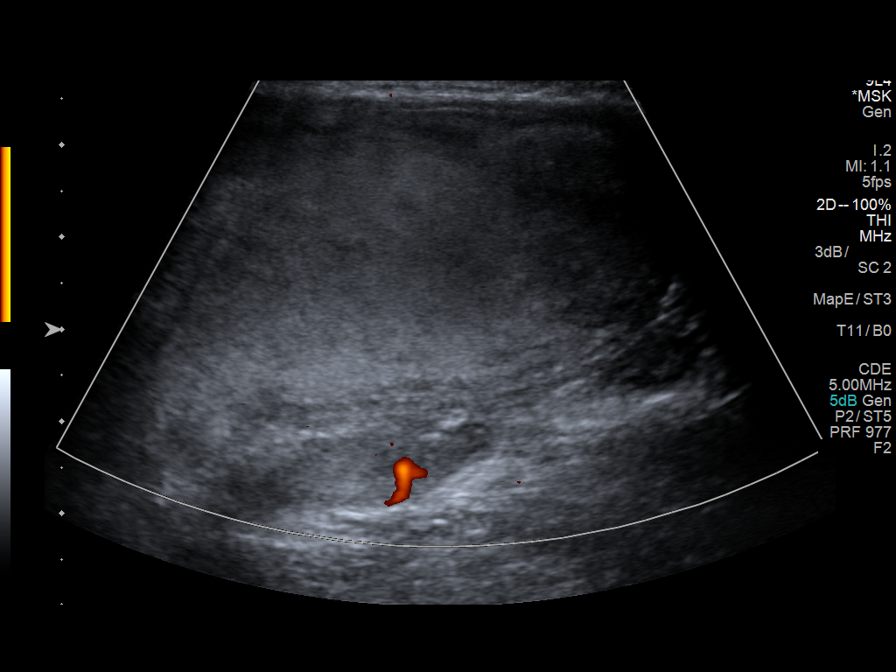
[im 6/8]
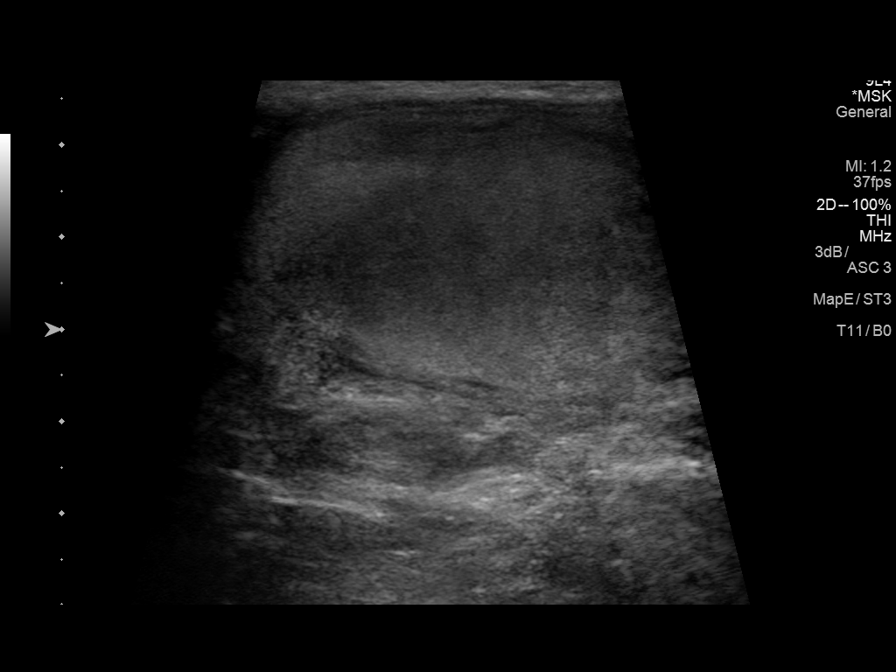
[im 7/8]
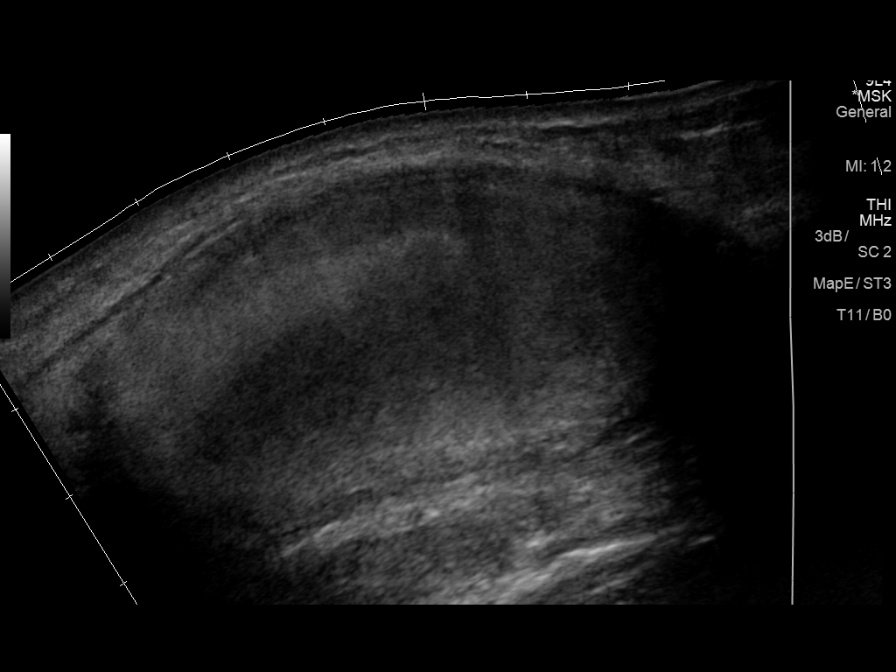
[im 8/8]
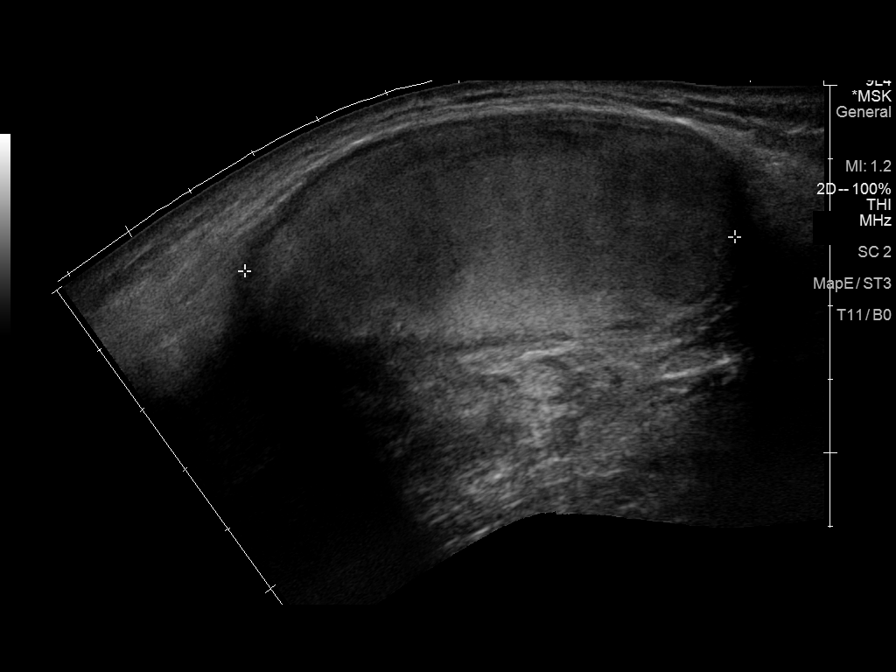

[8 of 8 positions shown; findings below may reference images not displayed]

FINDINGS: Sonographic interrogation of the region of clinical concern
demonstrates a 6.7 by 3.4 x 5.9 cm homogeneous soft tissue mass just
deep to the skin surface and within the superficial subcutaneous
fat. There is a suggestion of increased posterior acoustic
enhancement which suggests that this could represent a complex
cystic structure. No definite internal vascularity is identified on
color Doppler imaging.
IMPRESSION: The palpable abnormality corresponds with a circumscribed relatively
homogeneous soft tissue versus complex cystic mass measuring 6.7 x
3.4 x 5.9 cm in the superficial subcutaneous fat.

The primary differential consideration is a sebaceous cyst. Less
likely considerations include epidermoid cyst, and lymphadenopathy.
If further imaging is clinically warranted, MRI of the pelvis could
further evaluate. Otherwise, recommend surgical consultation for
excisional biopsy.

## 2024-01-27 ENCOUNTER — Emergency Department (HOSPITAL_BASED_OUTPATIENT_CLINIC_OR_DEPARTMENT_OTHER)
Admission: EM | Admit: 2024-01-27 | Discharge: 2024-01-27 | Disposition: A | Discharge status: Home / Self Care | Attending: Emergency Medicine | Admitting: Emergency Medicine

## 2024-01-27 ENCOUNTER — Other Ambulatory Visit: Payer: Self-pay

## 2024-01-27 ENCOUNTER — Emergency Department (HOSPITAL_BASED_OUTPATIENT_CLINIC_OR_DEPARTMENT_OTHER)

## 2024-01-27 ENCOUNTER — Encounter (HOSPITAL_BASED_OUTPATIENT_CLINIC_OR_DEPARTMENT_OTHER): Payer: Self-pay

## 2024-01-27 DIAGNOSIS — R1032 Left lower quadrant pain: Secondary | ICD-10-CM | POA: Diagnosis present

## 2024-01-27 DIAGNOSIS — I10 Essential (primary) hypertension: Secondary | ICD-10-CM | POA: Insufficient documentation

## 2024-01-27 DIAGNOSIS — J45909 Unspecified asthma, uncomplicated: Secondary | ICD-10-CM | POA: Diagnosis not present

## 2024-01-27 LAB — CBC WITH DIFFERENTIAL/PLATELET
Abs Immature Granulocytes: 0.04 K/uL (ref 0.00–0.07)
Basophils Absolute: 0 K/uL (ref 0.0–0.1)
Basophils Relative: 0 %
Eosinophils Absolute: 0.1 K/uL (ref 0.0–0.5)
Eosinophils Relative: 1 %
HCT: 43.8 % (ref 39.0–52.0)
Hemoglobin: 15.5 g/dL (ref 13.0–17.0)
Immature Granulocytes: 1 %
Lymphocytes Relative: 13 %
Lymphs Abs: 0.9 K/uL (ref 0.7–4.0)
MCH: 33.2 pg (ref 26.0–34.0)
MCHC: 35.4 g/dL (ref 30.0–36.0)
MCV: 93.8 fL (ref 80.0–100.0)
Monocytes Absolute: 0.7 K/uL (ref 0.1–1.0)
Monocytes Relative: 9 %
Neutro Abs: 5.8 K/uL (ref 1.7–7.7)
Neutrophils Relative %: 76 %
Platelets: 190 K/uL (ref 150–400)
RBC: 4.67 MIL/uL (ref 4.22–5.81)
RDW: 12.8 % (ref 11.5–15.5)
WBC: 7.6 K/uL (ref 4.0–10.5)
nRBC: 0 % (ref 0.0–0.2)

## 2024-01-27 LAB — URINALYSIS, W/ REFLEX TO CULTURE (INFECTION SUSPECTED)
Bilirubin Urine: NEGATIVE
Glucose, UA: NEGATIVE mg/dL
Hgb urine dipstick: NEGATIVE
Ketones, ur: NEGATIVE mg/dL
Leukocytes,Ua: NEGATIVE
Nitrite: NEGATIVE
Protein, ur: 100 mg/dL — AB
Specific Gravity, Urine: 1.025 (ref 1.005–1.030)
pH: 7 (ref 5.0–8.0)

## 2024-01-27 LAB — COMPREHENSIVE METABOLIC PANEL WITH GFR
ALT: 35 U/L (ref 0–44)
AST: 25 U/L (ref 15–41)
Albumin: 4.5 g/dL (ref 3.5–5.0)
Alkaline Phosphatase: 87 U/L (ref 38–126)
Anion gap: 12 (ref 5–15)
BUN: 13 mg/dL (ref 6–20)
CO2: 22 mmol/L (ref 22–32)
Calcium: 9.2 mg/dL (ref 8.9–10.3)
Chloride: 107 mmol/L (ref 98–111)
Creatinine, Ser: 1.07 mg/dL (ref 0.61–1.24)
GFR, Estimated: >60 mL/min
Glucose, Bld: 97 mg/dL (ref 70–99)
Potassium: 4.2 mmol/L (ref 3.5–5.1)
Sodium: 140 mmol/L (ref 135–145)
Total Bilirubin: 0.4 mg/dL (ref 0.0–1.2)
Total Protein: 6.8 g/dL (ref 6.5–8.1)

## 2024-01-27 LAB — LIPASE, BLOOD: Lipase: 33 U/L (ref 11–51)

## 2024-01-27 MED ORDER — SODIUM CHLORIDE 0.9 % IV BOLUS
500.0000 mL | Freq: Once | INTRAVENOUS | Status: AC
  Administered 2024-01-27: 500 mL via INTRAVENOUS

## 2024-01-27 MED ORDER — ONDANSETRON 4 MG PO TBDP
4.0000 mg | ORAL_TABLET | Freq: Three times a day (TID) | ORAL | 0 refills | Status: AC | PRN
Start: 2024-01-27 — End: ?

## 2024-01-27 MED ORDER — IOHEXOL 300 MG/ML  SOLN
100.0000 mL | Freq: Once | INTRAMUSCULAR | Status: AC | PRN
  Administered 2024-01-27: 100 mL via INTRAVENOUS

## 2024-01-27 MED ORDER — ONDANSETRON HCL 4 MG/2ML IJ SOLN
4.0000 mg | Freq: Once | INTRAMUSCULAR | Status: AC
  Administered 2024-01-27: 4 mg via INTRAVENOUS
  Filled 2024-01-27: qty 2

## 2024-01-27 MED ORDER — KETOROLAC TROMETHAMINE 30 MG/ML IJ SOLN
30.0000 mg | Freq: Once | INTRAMUSCULAR | Status: AC
  Administered 2024-01-27: 30 mg via INTRAVENOUS
  Filled 2024-01-27: qty 1

## 2024-01-27 MED ORDER — DICYCLOMINE HCL 20 MG PO TABS
20.0000 mg | ORAL_TABLET | Freq: Three times a day (TID) | ORAL | 0 refills | Status: AC | PRN
Start: 2024-01-27 — End: ?

## 2024-01-27 MED ORDER — MORPHINE SULFATE (PF) 4 MG/ML IV SOLN
4.0000 mg | Freq: Once | INTRAVENOUS | Status: AC
  Administered 2024-01-27: 4 mg via INTRAVENOUS
  Filled 2024-01-27: qty 1

## 2024-01-27 NOTE — ED Triage Notes (Signed)
 L sided flank pain, L sided abd pain, radiates to L testicle since this morning approx 0715. Reports N/V and fever Denies diarrhea, dysuria

## 2024-01-27 NOTE — Discharge Instructions (Signed)

## 2024-01-27 NOTE — ED Provider Notes (Signed)
 Emergency Department Provider Note   I have reviewed the triage vital signs and the nursing notes.   HISTORY  Chief Complaint Abdominal Pain and Testicle Pain   HPI Stephen Donaldson is a 48 y.o. male with past history of hypertension presents the emergency department with acute onset left flank and testicle pain.  Symptoms began while driving to work.  States it was so severe it caused him to pull over and vomit.  He had additional pain symptoms but was able to complete the drive to work where he was evaluated by the nurse at work and advised to present to the emergency department.  He reports fever when he arrived to work but had not been feeling infection symptoms prior.  No diarrhea or UTI symptoms.  Past Medical History:  Diagnosis Date   Asthma    Hypertension     Review of Systems  Constitutional: No fever/chills Cardiovascular: Denies chest pain. Respiratory: Denies shortness of breath. Gastrointestinal: Positive left flank/abdominal pain.  No nausea, no vomiting.  No diarrhea.   Musculoskeletal: Negative for back pain. Skin: Negative for rash. Neurological: Negative for headaches, focal weakness or numbness.  ____________________________________________   PHYSICAL EXAM:  VITAL SIGNS: ED Triage Vitals  Encounter Vitals Group     BP 01/27/24 0835 (!) 151/102     Pulse Rate 01/27/24 0835 100     Resp 01/27/24 0835 18     Temp 01/27/24 0835 97.8 F (36.6 C)     Temp Source 01/27/24 0835 Oral     SpO2 01/27/24 0835 99 %   Constitutional: Alert and oriented. Well appearing and in no acute distress. Eyes: Conjunctivae are normal.  Head: Atraumatic. Nose: No congestion/rhinnorhea. Mouth/Throat: Mucous membranes are moist.   Neck: No stridor.   Cardiovascular: Normal rate, regular rhythm. Good peripheral circulation. Grossly normal heart sounds.   Respiratory: Normal respiratory effort.  No retractions. Lungs CTAB. Gastrointestinal: Soft and nontender. No  distention.  GU: Performed GU exam with nurse chaperone at bedside and after obtaining patient's verbal consent.  Tenderness to the posterior left testicle.  No inguinal masses or tenderness. Musculoskeletal: No gross deformities of extremities. Neurologic:  Normal speech and language.   ____________________________________________   LABS (all labs ordered are listed, but only abnormal results are displayed)  Labs Reviewed  URINALYSIS, W/ REFLEX TO CULTURE (INFECTION SUSPECTED) - Abnormal; Notable for the following components:      Result Value   Protein, ur 100 (*)    Bacteria, UA FEW (*)    All other components within normal limits  COMPREHENSIVE METABOLIC PANEL WITH GFR  LIPASE, BLOOD  CBC WITH DIFFERENTIAL/PLATELET   ____________________________________________   PROCEDURES  Procedure(s) performed:   Procedures  None ____________________________________________   INITIAL IMPRESSION / ASSESSMENT AND PLAN / ED COURSE  Pertinent labs & imaging results that were available during my care of the patient were reviewed by me and considered in my medical decision making (see chart for details).   This patient is Presenting for Evaluation of abdominal pain, which does require a range of treatment options, and is a complaint that involves a high risk of morbidity and mortality.  The Differential Diagnoses includes but is not exclusive to acute appendicitis, renal colic, testicular torsion, urinary tract infection, prostatitis,  diverticulitis, small bowel obstruction, colitis, abdominal aortic aneurysm, gastroenteritis, constipation etc.   Critical Interventions-    Medications  sodium chloride 0.9 % bolus 500 mL (0 mLs Intravenous Stopped 01/27/24 1020)  morphine (PF) 4 MG/ML  injection 4 mg (4 mg Intravenous Given 01/27/24 0856)  ondansetron (ZOFRAN) injection 4 mg (4 mg Intravenous Given 01/27/24 0857)  ketorolac (TORADOL) 30 MG/ML injection 30 mg (30 mg Intravenous Given  01/27/24 0942)  iohexol (OMNIPAQUE) 300 MG/ML solution 100 mL (100 mLs Intravenous Contrast Given 01/27/24 1035)    Reassessment after intervention: pain improved.   Clinical Laboratory Tests Ordered, included CMP with normal LFts. Lipase negative. CBC without anemia.   Radiologic Tests Ordered, included testicle US . I independently interpreted the images and agree with radiology interpretation.   Cardiac Monitor Tracing which shows NSR.    Social Determinants of Health Risk patient is not an active smoker.   Medical Decision Making: Summary:  The patient presents to the emergency department for evaluation of acute onset left flank and testicle pain.  Afebrile here.  No SIRS vitals.  Plan to start with testicle ultrasound to rule out torsion although suspicion for this is lower.  If negative, plan for CT abdomen pelvis and reassess.  Reevaluation with update and discussion with patient. US  and CT are reassuring. No clear cause of symptoms. Pain improved. Plan for continued pain mgmt at home.   Patient's presentation is most consistent with acute presentation with potential threat to life or bodily function.   Disposition: discharge  ____________________________________________  FINAL CLINICAL IMPRESSION(S) / ED DIAGNOSES  Final diagnoses:  Left lower quadrant abdominal pain     NEW OUTPATIENT MEDICATIONS STARTED DURING THIS VISIT:  Discharge Medication List as of 01/27/2024 11:58 AM     START taking these medications   Details  dicyclomine (BENTYL) 20 MG tablet Take 1 tablet (20 mg total) by mouth 3 (three) times daily as needed., Starting Wed 01/27/2024, Normal    ondansetron (ZOFRAN-ODT) 4 MG disintegrating tablet Take 1 tablet (4 mg total) by mouth every 8 (eight) hours as needed., Starting Wed 01/27/2024, Normal        Note:  This document was prepared using Dragon voice recognition software and may include unintentional dictation errors.  Fonda Law, MD,  [facility]    Nayleah Gamel, Fonda MATSU, MD 01/31/24 [phone]
